# Patient Record
Sex: Male | Born: 1970 | Race: White | Hispanic: No | Marital: Single | Smoking: Former smoker
Health system: Southern US, Community
[De-identification: ages and names within clinical notes are randomized; demographics above are authoritative.]

## PROBLEM LIST (undated history)

## (undated) DIAGNOSIS — F259 Schizoaffective disorder, unspecified: Secondary | ICD-10-CM

## (undated) DIAGNOSIS — J45909 Unspecified asthma, uncomplicated: Secondary | ICD-10-CM

## (undated) DIAGNOSIS — F250 Schizoaffective disorder, bipolar type: Secondary | ICD-10-CM

## (undated) DIAGNOSIS — I10 Essential (primary) hypertension: Secondary | ICD-10-CM

## (undated) DIAGNOSIS — B1920 Unspecified viral hepatitis C without hepatic coma: Secondary | ICD-10-CM

## (undated) HISTORY — DX: Essential (primary) hypertension: I10

## (undated) HISTORY — DX: Unspecified viral hepatitis C without hepatic coma: B19.20

## (undated) HISTORY — DX: Schizoaffective disorder, unspecified: F25.9

## (undated) HISTORY — PX: NO PAST SURGERIES: SHX2092

## (undated) HISTORY — DX: Schizoaffective disorder, bipolar type: F25.0

---

## 2014-04-13 ENCOUNTER — Emergency Department (HOSPITAL_COMMUNITY)
Admission: EM | Admit: 2014-04-13 | Discharge: 2014-04-13 | Disposition: A | Discharge status: Home / Self Care | Payer: Medicaid Other | Attending: Emergency Medicine | Admitting: Emergency Medicine

## 2014-04-13 ENCOUNTER — Encounter (HOSPITAL_COMMUNITY): Payer: Self-pay | Admitting: Emergency Medicine

## 2014-04-13 DIAGNOSIS — K029 Dental caries, unspecified: Secondary | ICD-10-CM | POA: Insufficient documentation

## 2014-04-13 DIAGNOSIS — K089 Disorder of teeth and supporting structures, unspecified: Secondary | ICD-10-CM | POA: Diagnosis not present

## 2014-04-13 DIAGNOSIS — L255 Unspecified contact dermatitis due to plants, except food: Secondary | ICD-10-CM | POA: Insufficient documentation

## 2014-04-13 DIAGNOSIS — R21 Rash and other nonspecific skin eruption: Secondary | ICD-10-CM | POA: Insufficient documentation

## 2014-04-13 DIAGNOSIS — K0889 Other specified disorders of teeth and supporting structures: Secondary | ICD-10-CM

## 2014-04-13 MED ORDER — TRAMADOL HCL 50 MG PO TABS: 50.0000 mg | ORAL_TABLET | Freq: Four times a day (QID) | ORAL | Status: DC | PRN

## 2014-04-13 MED ORDER — PREDNISONE 10 MG PO TABS: ORAL_TABLET | ORAL | Status: DC

## 2014-04-13 NOTE — ED Notes (Signed)
Pt states he got into some Poision IVY about three weeks ago. Has tried "everything" to get rid of it. Has it between fingers on both hands, up lower arms, and on both feet and lower legs.

## 2014-04-13 NOTE — ED Provider Notes (Signed)
CSN: [id number]     Arrival date & time 04/13/14  1100 History   First MD Initiated Contact with Patient 04/13/14 1136    This chart was scribed for non-physician practitioner Pauline Ausammy Bralynn Velador, PA-C working with Rolland PorterMark James, MD, by Andrew Auaven Small, ED Scribe. This patient was seen in room APFT20/APFT20 and the patient's care was started at 11:38 AM. Chief Complaint  Patient presents with  . Rash   Patient is a 43 y.o. male presenting with rash. The history is provided by the patient. No language interpreter was used.  Rash Associated symptoms: no fever, no headaches, no shortness of breath, no sore throat and not wheezing     Dustin CokeMichael Small is a 43 y.o. male who presents to the Emergency Department complaining of worsening rash that began 3 weeks ago. Pt states he was working in poison ivy/oak a couple weeks ago. He reports rash has been spreading to bilateral leg, hands, feet, wrist and arms. States he has tried calamine treatments without relief to rash. He denies taking benadryl. He denies SOB, and trouble swallowing.  Pt also c/o lower dental pain. He reports an upcoming appointment with dentist. Pt is requesting pain medication until appointment    History reviewed. No pertinent past medical history. History reviewed. No pertinent past surgical history. History reviewed. No pertinent family history. History  Substance Use Topics  . Smoking status: Never Smoker   . Smokeless tobacco: Not on file  . Alcohol Use: Not on file    Review of Systems  Constitutional: Negative for fever, chills, activity change and appetite change.  HENT: Positive for dental problem. Negative for facial swelling, sore throat and trouble swallowing.   Respiratory: Negative for apnea, chest tightness, shortness of breath and wheezing.   Musculoskeletal: Negative for neck pain and neck stiffness.  Skin: Positive for rash. Negative for wound.  Neurological: Negative for dizziness, weakness, numbness and headaches.    All other systems reviewed and are negative.  Allergies  Review of patient's allergies indicates not on file.  Home Medications   Prior to Admission medications   Not on File   Triage Vitals- BP 125/69  Pulse 73  Temp(Src) 97.4 F (36.3 C) (Oral)  Resp 18  Ht 5\' 8"  (1.727 m)  Wt 209 lb (94.802 kg)  BMI 31.79 kg/m2  SpO2 100% Physical Exam  Nursing note and vitals reviewed. Constitutional: He is oriented to person, place, and time. He appears well-developed and well-nourished. No distress.  HENT:  Head: Normocephalic and atraumatic.  Mouth/Throat: Oropharynx is clear and moist and mucous membranes are normal.    Multiple dental caries no obvious dental abscess and no facial edema.   Eyes: Conjunctivae and EOM are normal.  Neck: Normal range of motion. Neck supple.  Cardiovascular: Normal rate, regular rhythm and normal heart sounds.  Exam reveals no gallop and no friction rub.   No murmur heard. Pulmonary/Chest: Effort normal and breath sounds normal. No respiratory distress. He has no wheezes. He has no rales. He exhibits no tenderness.  Musculoskeletal: Normal range of motion.  Lymphadenopathy:    He has no cervical adenopathy.  Neurological: He is alert and oriented to person, place, and time.  Skin: Skin is warm and dry.  Scattered erythematous papule to bilateral wrist dorsal hand and ankles. No vesicles or pustules. No edema.   Psychiatric: He has a normal mood and affect. His behavior is normal.    ED Course  Procedures  DIAGNOSTIC STUDIES: Oxygen Saturation is 100% on  RA, normal by my interpretation.    COORDINATION OF CARE: 11:44 AM- Pt advised of plan for treatment including benadryl and ultram and pt agrees.  Labs Review Labs Reviewed - No data to display  Imaging Review No results found.   EKG Interpretation None      MDM   Final diagnoses:  Plant dermatitis  Pain, dental   Pt is well appearing, no concerning sx's for infection to the  floor of the mouth or deep structures of the neck.  Rash appears c/w plant dermatitis, pt agrees to ultram for dental pain and prednisone taper.  VSS.  He appears stable for d/c  I personally performed the services described in this documentation, which was scribed in my presence. The recorded information has been reviewed and is accurate.      Jacorie Ernsberger L. Moya Duan, PA-C 04/16/14 1235

## 2014-04-24 NOTE — ED Provider Notes (Signed)
Medical screening examination/treatment/procedure(s) were performed by non-physician practitioner and as supervising physician I was immediately available for consultation/collaboration.   EKG Interpretation None        Rolland PorterMark Luceil Herrin, MD 04/24/14 [phone]

## 2014-04-27 ENCOUNTER — Emergency Department (HOSPITAL_COMMUNITY)
Admission: EM | Admit: 2014-04-27 | Discharge: 2014-04-27 | Disposition: A | Discharge status: Home / Self Care | Payer: Medicaid Other | Attending: Emergency Medicine | Admitting: Emergency Medicine

## 2014-04-27 ENCOUNTER — Encounter (HOSPITAL_COMMUNITY): Payer: Self-pay | Admitting: Emergency Medicine

## 2014-04-27 DIAGNOSIS — L03039 Cellulitis of unspecified toe: Principal | ICD-10-CM | POA: Insufficient documentation

## 2014-04-27 DIAGNOSIS — Z79899 Other long term (current) drug therapy: Secondary | ICD-10-CM | POA: Diagnosis not present

## 2014-04-27 DIAGNOSIS — Z4800 Encounter for change or removal of nonsurgical wound dressing: Secondary | ICD-10-CM | POA: Diagnosis present

## 2014-04-27 DIAGNOSIS — L03032 Cellulitis of left toe: Secondary | ICD-10-CM

## 2014-04-27 DIAGNOSIS — T63004D Toxic effect of unspecified snake venom, undetermined, subsequent encounter: Secondary | ICD-10-CM

## 2014-04-27 DIAGNOSIS — R509 Fever, unspecified: Secondary | ICD-10-CM | POA: Diagnosis not present

## 2014-04-27 DIAGNOSIS — L02619 Cutaneous abscess of unspecified foot: Secondary | ICD-10-CM | POA: Insufficient documentation

## 2014-04-27 LAB — CBC WITH DIFFERENTIAL/PLATELET
Basophils Absolute: 0.1 10*3/uL (ref 0.0–0.1)
Basophils Relative: 1 % (ref 0–1)
EOS PCT: 2 % (ref 0–5)
Eosinophils Absolute: 0.2 10*3/uL (ref 0.0–0.7)
HEMATOCRIT: 40.5 % (ref 39.0–52.0)
HEMOGLOBIN: 14.1 g/dL (ref 13.0–17.0)
LYMPHS ABS: 1.3 10*3/uL (ref 0.7–4.0)
LYMPHS PCT: 18 % (ref 12–46)
MCH: 31.7 pg (ref 26.0–34.0)
MCHC: 34.8 g/dL (ref 30.0–36.0)
MCV: 91 fL (ref 78.0–100.0)
Monocytes Absolute: 0.3 10*3/uL (ref 0.1–1.0)
Monocytes Relative: 4 % (ref 3–12)
NEUTROS PCT: 75 % (ref 43–77)
Neutro Abs: 5.7 10*3/uL (ref 1.7–7.7)
Platelets: 212 10*3/uL (ref 150–400)
RBC: 4.45 MIL/uL (ref 4.22–5.81)
RDW: 12.6 % (ref 11.5–15.5)
WBC: 7.6 10*3/uL (ref 4.0–10.5)

## 2014-04-27 MED ORDER — OXYCODONE-ACETAMINOPHEN 5-325 MG PO TABS: 1.0000 | ORAL_TABLET | Freq: Four times a day (QID) | ORAL | Status: DC | PRN

## 2014-04-27 MED ORDER — HYDROCORTISONE 1 % EX CREA
TOPICAL_CREAM | CUTANEOUS | Status: DC
Start: 2014-04-27 — End: 2014-06-17

## 2014-04-27 MED ORDER — OXYCODONE-ACETAMINOPHEN 5-325 MG PO TABS
2.0000 | ORAL_TABLET | Freq: Once | ORAL | Status: AC
  Administered 2014-04-27: 2 via ORAL
  Filled 2014-04-27: qty 2

## 2014-04-27 MED ORDER — CEPHALEXIN 500 MG PO CAPS
500.0000 mg | ORAL_CAPSULE | Freq: Once | ORAL | Status: AC
  Administered 2014-04-27: 500 mg via ORAL
  Filled 2014-04-27: qty 1

## 2014-04-27 MED ORDER — CEPHALEXIN 500 MG PO CAPS: 500.0000 mg | ORAL_CAPSULE | Freq: Four times a day (QID) | ORAL | Status: DC

## 2014-04-27 NOTE — Discharge Instructions (Signed)
Snake Bite Snakes may be either venomous (containing poison) or nonvenomous (nonpoisonous). A nonvenomous snake bite will cause trauma or a wound to the skin and possibly the deeper tissues. A venomous snake will also cause a traumatic wound, but more importantly, it may have injected venom into the wound. Snake bite venom can be extremely serious and even deadly. One type of venom may cause major skin, tissue and muscle damage, and failure of normal blood clotting. This may cause extreme swelling and pain of the affected area. Another type of venom can affect the brain and nervous system and may cause death. The treatment for venomous snake bite may require the use of antivenom medicine. If you are unsure if your bite is from a venomous snake, you MUST seek immediate medical attention. YOU MIGHT NEED A TETANUS SHOT NOW IF:  You have no idea when you had the last one.  You have never had a tetanus shot before.  The bite broke your skin. If you need a tetanus shot, and you decide not to get one, there is a rare chance of getting tetanus. Sickness from tetanus can be serious. HOME CARE INSTRUCTIONS  A snake bit you and caused a skin wound. It may or may not have been venomous. If the snake was venomous, a small amount of venom may have been injected into your skin.  Keep the bite area clean and dry.  Keep the extremity elevated above the level of the heart for the next 48 hours.  Wash the bite area 3 times daily with soap and water or an antiseptic. Apply an adhesive or gauze bandage to the bite area.  If you develop blistering of any type at the site of the bite, protect the blisters from breaking. Do not attempt to open it.  If you were given a tetanus shot, your arm may get swollen, red and warm at the shot site. This is a common response to the injection. SEEK IMMEDIATE MEDICAL CARE IF:   You develop symptoms of poisoning including increased pain, redness, swelling, blood blisters or purple  spots in the bite area, nausea, vomiting, numbness, tingling, excessive sweating, breathing difficulty, blurred vision, feelings of lightheadedness, or feeling faint. If you develop symptoms of poisoning, you MUST seek immediate medical attention.  The bite becomes infected. Symptoms may include redness, swelling, pain, tenderness, pus, red streaks running from the wound, or an oral temperature above 102 F (38.9 C), not controlled by medicine.  Your condition or wound becomes worse. MAKE SURE YOU:   Understand these instructions.  Will watch your condition.  Will get help right away if you are not doing well or get worse. Document Released: 09/08/2000 Document Revised: 12/04/2011 Document Reviewed: 02/02/2010 [facility] Patient Information 2015 [place] [place]. This information is not intended to replace advice given to you by your health care provider. Make sure you discuss any questions you have with your health care provider. Cellulitis Cellulitis is an infection of the skin and the tissue beneath it. The infected area is usually red and tender. Cellulitis occurs most often in the arms and lower legs.  CAUSES  Cellulitis is caused by bacteria that enter the skin through cracks or cuts in the skin. The most common types of bacteria that cause cellulitis are staphylococci and streptococci. SIGNS AND SYMPTOMS   Redness and warmth.  Swelling.  Tenderness or pain.  Fever. DIAGNOSIS  Your health care provider can usually determine what is wrong based on a physical exam. Blood tests  may also be done. TREATMENT  Treatment usually involves taking an antibiotic medicine. HOME CARE INSTRUCTIONS   Take your antibiotic medicine as directed by your health care provider. Finish the antibiotic even if you start to feel better.  Keep the infected arm or leg elevated to reduce swelling.  Apply a warm cloth to the affected area up to 4 times per day to relieve pain.  Take medicines only as  directed by your health care provider.  Keep all follow-up visits as directed by your health care provider. SEEK MEDICAL CARE IF:   You notice red streaks coming from the infected area.  Your red area gets larger or turns dark in color.  Your bone or joint underneath the infected area becomes painful after the skin has healed.  Your infection returns in the same area or another area.  You notice a swollen bump in the infected area.  You develop new symptoms.  You have a fever. SEEK IMMEDIATE MEDICAL CARE IF:   You feel very sleepy.  You develop vomiting or diarrhea.  You have a general ill feeling (malaise) with muscle aches and pains. MAKE SURE YOU:   Understand these instructions.  Will watch your condition.  Will get help right away if you are not doing well or get worse. Document Released: 06/21/2005 Document Revised: 01/26/2014 Document Reviewed: 11/27/2011 [facility] Patient Information 2015 [place], [place]. This information is not intended to replace advice given to you by your health care provider. Make sure you discuss any questions you have with your health care provider.

## 2014-04-27 NOTE — ED Notes (Signed)
Follow up snake bite on Friday to left foot.  C/o pain rating pain 10.

## 2014-04-27 NOTE — ED Provider Notes (Signed)
CSN: [id number]     Arrival date & time 04/27/14  [id number] History   First MD Initiated Contact with Patient 04/27/14 0944    This chart was scribed for Shon Batonourtney F Horton, MD by Marica OtterNusrat Rahman, ED Scribe. This patient was seen in room APA01/APA01 and the patient's care was started at 9:49 AM.  Chief Complaint  Patient presents with  . Snake Bite  PCP: No PCP Per Patient  The history is provided by the patient. No language interpreter was used.   HPI Comments: Dustin Small is a 43 y.o. male who presents to the Emergency Department for a follow-up to a snake bite to his left foot sustained on 03/18/14.  Pt [facility] and was admitted to the hospital. Pt was discharged from the hospital on 03/19/14. Pt states that he was provided Rx for pain meds at time of discharge which pt ran out of today. Rates pain at 10/10.  Worse with ambulation.  Pt also complains of associated intermittent redness to his right foot and swelling ((though pt note his swelling has subsided considerably). Pt also complains of associated intermittent fever, stating that he had a temperature of 101 degrees yesterday.   History reviewed. No pertinent past medical history. History reviewed. No pertinent past surgical history. History reviewed. No pertinent family history. History  Substance Use Topics  . Smoking status: Never Smoker   . Smokeless tobacco: Not on file  . Alcohol Use: Not on file    Review of Systems  Constitutional: Positive for fever.  Musculoskeletal:       Left foot pain  Skin: Positive for color change and wound.  All other systems reviewed and are negative.     Allergies  Review of patient's allergies indicates no known allergies.  Home Medications   Prior to Admission medications   Medication Sig Start Date End Date Taking? Authorizing Provider  albuterol (PROVENTIL HFA;VENTOLIN HFA) 108 (90 BASE) MCG/ACT inhaler Inhale 1 puff into the  lungs every 6 (six) hours as needed for wheezing or shortness of breath.   Yes Historical Provider, MD  haloperidol (HALDOL) 5 MG tablet Take 5 mg by mouth at bedtime.   Yes Historical Provider, MD  lisinopril-hydrochlorothiazide (PRINZIDE,ZESTORETIC) 10-12.5 MG per tablet Take 1 tablet by mouth daily.   Yes Historical Provider, MD  oxyCODONE-acetaminophen (PERCOCET/ROXICET) 5-325 MG per tablet Take 1 tablet by mouth every 4 (four) hours as needed for moderate pain.  04/18/14  Yes Historical Provider, MD  trazodone (DESYREL) 300 MG tablet Take 300 mg by mouth at bedtime.   Yes Historical Provider, MD  cephALEXin (KEFLEX) 500 MG capsule Take 1 capsule (500 mg total) by mouth 4 (four) times daily. 04/27/14   Shon Batonourtney F Horton, MD  oxyCODONE-acetaminophen (PERCOCET/ROXICET) 5-325 MG per tablet Take 1-2 tablets by mouth every 6 (six) hours as needed for severe pain. 04/27/14   Shon Batonourtney F Horton, MD   Triage Vitals: BP 119/69  Pulse 88  Temp(Src) 97.7 F (36.5 C) (Oral)  Ht 5\' 7"  (1.702 m)  Wt 209 lb (94.802 kg)  BMI 32.73 kg/m2  SpO2 100% Physical Exam  Nursing note and vitals reviewed. Constitutional: He is oriented to person, place, and time. He appears well-developed and well-nourished. No distress.  HENT:  Head: Normocephalic and atraumatic.  Cardiovascular: Normal rate, regular rhythm and normal heart sounds.   No murmur heard. Pulmonary/Chest: Effort normal and breath sounds normal. No respiratory distress. He has no  wheezes.  Musculoskeletal:  Focused examination of the left lower extremity reveals swelling mostly involving the left foot, there is mild erythema over the lateral aspect of the dorsum of the left foot proximal to the bite site, 2 punctate wounds noted over the left fourth digit and left second digit  Neurological: He is alert and oriented to person, place, and time.  Skin: Skin is warm and dry.  Blanching erythema of the dorsum of left foot as described above  Psychiatric: He  has a normal mood and affect.    ED Course  Procedures (including critical care time) DIAGNOSTIC STUDIES: Oxygen Saturation is 100% on RA, nl by my interpretation.    COORDINATION OF CARE: 9:54 AM-Discussed treatment plan which includes  with pt at bedside and pt agreed to plan.   Labs Review Labs Reviewed  CBC WITH DIFFERENTIAL    Imaging Review No results found.   EKG Interpretation None      MDM   Final diagnoses:  Snake bite, undetermined intent, subsequent encounter  Cellulitis of toe of left foot   Patient presents for followup following a snake bite. He was bitten approximately 10 days ago. I reviewed the patient's records. He was admitted to [facility] and was given CroFab. He was discharged on July 25. Patient reports worsening pain after running out of his pain medication yesterday. He also has residual redness and swelling although he endorses this is better. CBC obtained to evaluate for leukocytosis given fever and continued redness. CBC reassuring including white count and platelets. Patient was given pain medications. Would expect at this point for redness be partially resolved. Given puncture wound and risk  for overlying cellulitis, we'll treat with Keflex for 7 days.  Patient also be given a prescription for pain medications.  After history, exam, and medical workup I feel the patient has been appropriately medically screened and is safe for discharge home. Pertinent diagnoses were discussed with the patient. Patient was given return precautions.   I personally performed the services described in this documentation, which was scribed in my presence. The recorded information has been reviewed and is accurate.    Shon Baton, MD 04/27/14 1056

## 2014-06-17 ENCOUNTER — Encounter (HOSPITAL_COMMUNITY): Payer: Self-pay | Admitting: Emergency Medicine

## 2014-06-17 ENCOUNTER — Emergency Department (HOSPITAL_COMMUNITY)
Admission: EM | Admit: 2014-06-17 | Discharge: 2014-06-17 | Disposition: A | Discharge status: Home / Self Care | Payer: Medicaid Other | Attending: Emergency Medicine | Admitting: Emergency Medicine

## 2014-06-17 DIAGNOSIS — R21 Rash and other nonspecific skin eruption: Secondary | ICD-10-CM | POA: Diagnosis present

## 2014-06-17 DIAGNOSIS — B86 Scabies: Secondary | ICD-10-CM | POA: Diagnosis not present

## 2014-06-17 DIAGNOSIS — Z87891 Personal history of nicotine dependence: Secondary | ICD-10-CM | POA: Insufficient documentation

## 2014-06-17 DIAGNOSIS — Z79899 Other long term (current) drug therapy: Secondary | ICD-10-CM | POA: Insufficient documentation

## 2014-06-17 MED ORDER — PERMETHRIN 5 % EX CREA
TOPICAL_CREAM | CUTANEOUS | Status: DC
Start: 2014-06-17 — End: 2018-02-13

## 2014-06-17 NOTE — Discharge Instructions (Signed)

## 2014-06-17 NOTE — ED Provider Notes (Signed)
Medical screening examination/treatment/procedure(s) were performed by non-physician practitioner and as supervising physician I was immediately available for consultation/collaboration.   EKG Interpretation None        Benny Lennert, MD 06/17/14 [phone]

## 2014-06-17 NOTE — ED Notes (Signed)
Red pin point raised rash to bilateral feet and bilateral hands/arms. Burns when in sunlight. Has been seen and given ointment for the rash without any relief.

## 2014-06-17 NOTE — ED Provider Notes (Signed)
CSN: [id number]     Arrival date & time 06/17/14  1407 History   First MD Initiated Contact with Patient 06/17/14 1536     Chief Complaint  Patient presents with  . Rash     (Consider location/radiation/quality/duration/timing/severity/associated sxs/prior Treatment) Patient is a 43 y.o. male presenting with rash. The history is provided by the patient. No language interpreter was used.  Rash Location:  Shoulder/arm and foot Shoulder/arm rash location:  L wrist, R wrist, L hand and R hand Foot rash location:  L ankle, R ankle, L foot and R foot Quality: itchiness   Duration:  3 months Progression:  Worsening Associated symptoms: no fever and no nausea   Associated symptoms comment:  Rash that started on feet three months earlier, now on lower extremities, hands and wrists.    History reviewed. No pertinent past medical history. History reviewed. No pertinent past surgical history. History reviewed. No pertinent family history. History  Substance Use Topics  . Smoking status: Former Games developer  . Smokeless tobacco: Not on file  . Alcohol Use: No    Review of Systems  Constitutional: Negative for fever.  HENT: Negative.   Respiratory: Negative.   Cardiovascular: Negative.   Gastrointestinal: Negative.  Negative for nausea.  Musculoskeletal: Negative.   Skin: Positive for rash.      Allergies  Review of patient's allergies indicates no known allergies.  Home Medications   Prior to Admission medications   Medication Sig Start Date End Date Taking? Authorizing Provider  albuterol (PROVENTIL HFA;VENTOLIN HFA) 108 (90 BASE) MCG/ACT inhaler Inhale 1 puff into the lungs every 6 (six) hours as needed for wheezing or shortness of breath.   Yes Historical Provider, MD  haloperidol (HALDOL) 5 MG tablet Take 5 mg by mouth at bedtime.   Yes Historical Provider, MD  lisinopril-hydrochlorothiazide (PRINZIDE,ZESTORETIC) 10-12.5 MG per tablet Take 1 tablet by mouth daily.   Yes  Historical Provider, MD  trazodone (DESYREL) 300 MG tablet Take 300 mg by mouth at bedtime.   Yes Historical Provider, MD   BP 133/83  Pulse 66  Temp(Src) 98 F (36.7 C) (Oral)  Resp 14  Ht  (1.702 m)  Wt 200 lb (90.719 kg)  BMI 31.32 kg/m2  SpO2 99% Physical Exam  Constitutional: He is oriented to person, place, and time. He appears well-developed and well-nourished.  Neck: Normal range of motion.  Pulmonary/Chest: Effort normal.  Musculoskeletal: Normal range of motion.  Neurological: He is alert and oriented to person, place, and time.  Skin: Skin is warm and dry.  Raised rash to dorsum hands, including web spaces, extending along volar aspect forearms. Similar rash to dorsal feet to ankles and lower legs. Various stages of scabbing and new eruption.  Psychiatric: He has a normal mood and affect.    ED Course  Procedures (including critical care time) Labs Review Labs Reviewed - No data to display  Imaging Review No results found.   EKG Interpretation None      MDM   Final diagnoses:  None    1. Scabies  Discussed care of infection, including household cleaning.     Arnoldo Hooker, PA-C 06/17/14 1601

## 2016-06-09 ENCOUNTER — Other Ambulatory Visit (HOSPITAL_COMMUNITY)
Admission: RE | Admit: 2016-06-09 | Discharge: 2016-06-09 | Disposition: A | Discharge status: Home / Self Care | Payer: Medicaid Other | Source: Ambulatory Visit | Attending: Internal Medicine | Admitting: Internal Medicine

## 2016-06-09 DIAGNOSIS — B1920 Unspecified viral hepatitis C without hepatic coma: Secondary | ICD-10-CM | POA: Insufficient documentation

## 2016-06-09 LAB — FERRITIN: FERRITIN: 116 ng/mL (ref 24–336)

## 2016-06-09 LAB — IRON AND TIBC
IRON: 89 ug/dL (ref 45–182)
Saturation Ratios: 19 % (ref 17.9–39.5)
TIBC: 461 ug/dL — ABNORMAL HIGH (ref 250–450)
UIBC: 372 ug/dL

## 2016-06-10 LAB — HEPATITIS A ANTIBODY, TOTAL: Hep A Total Ab: NEGATIVE

## 2016-06-10 LAB — HEPATITIS B SURFACE ANTIBODY, QUANTITATIVE: Hepatitis B-Post: <3.1 m[IU]/mL — ABNORMAL LOW (ref >9.9)

## 2016-06-10 LAB — HCV RNA (INTERNATIONAL UNITS)
HCV LOG10: 7.427 {Log_IU}/mL
HCV RNA (International Units): 26700000 IU/mL

## 2016-06-10 LAB — HCV RNA QUANT

## 2016-06-10 LAB — HEPATITIS B CORE ANTIBODY, TOTAL: HEP B C TOTAL AB: NEGATIVE

## 2016-06-10 LAB — HEPATITIS B SURFACE ANTIGEN: Hepatitis B Surface Ag: NEGATIVE

## 2016-06-10 LAB — ALPHA-1-ANTITRYPSIN: A-1 Antitrypsin, Ser: 134 mg/dL (ref 90–200)

## 2016-06-12 LAB — ANTINUCLEAR ANTIBODIES, IFA: ANA Ab, IFA: NEGATIVE

## 2016-06-13 ENCOUNTER — Other Ambulatory Visit (HOSPITAL_COMMUNITY): Payer: Self-pay | Admitting: Internal Medicine

## 2016-06-13 DIAGNOSIS — B182 Chronic viral hepatitis C: Secondary | ICD-10-CM

## 2016-06-20 ENCOUNTER — Ambulatory Visit (HOSPITAL_COMMUNITY)
Admission: RE | Admit: 2016-06-20 | Discharge: 2016-06-20 | Disposition: A | Discharge status: Home / Self Care | Payer: Medicaid Other | Source: Ambulatory Visit | Attending: Internal Medicine | Admitting: Internal Medicine

## 2016-06-20 DIAGNOSIS — Z228 Carrier of other infectious diseases: Secondary | ICD-10-CM | POA: Insufficient documentation

## 2016-06-20 DIAGNOSIS — B182 Chronic viral hepatitis C: Secondary | ICD-10-CM | POA: Insufficient documentation

## 2016-06-20 DIAGNOSIS — N200 Calculus of kidney: Secondary | ICD-10-CM | POA: Diagnosis not present

## 2016-06-20 DIAGNOSIS — K760 Fatty (change of) liver, not elsewhere classified: Secondary | ICD-10-CM | POA: Insufficient documentation

## 2016-06-20 DIAGNOSIS — N2889 Other specified disorders of kidney and ureter: Secondary | ICD-10-CM | POA: Insufficient documentation

## 2018-02-13 ENCOUNTER — Ambulatory Visit (INDEPENDENT_AMBULATORY_CARE_PROVIDER_SITE_OTHER): Payer: Medicaid Other | Admitting: Internal Medicine

## 2018-02-13 ENCOUNTER — Encounter (INDEPENDENT_AMBULATORY_CARE_PROVIDER_SITE_OTHER): Payer: Self-pay | Admitting: Internal Medicine

## 2018-02-13 VITALS — BP 140/66 | HR 68 | Temp 97.7°F | Ht 68.0 in | Wt 198.3 lb

## 2018-02-13 DIAGNOSIS — B182 Chronic viral hepatitis C: Secondary | ICD-10-CM | POA: Diagnosis not present

## 2018-02-13 NOTE — Progress Notes (Addendum)
   Subjective:    Patient ID: Dustin Small, male    DOB: 1971/06/26, 46 y.o.   MRN: [id number]  HPI Referred by Dr. Neita Carp for Hep. C. Was evaluated by Dr. Teena Dunk back in October of 2017. He was treated with Mavyret x 12 weeks. Genotype 1A Hx of cocaine abuse years. No Hx of IV drug abuse.  Has received Hep A and B per records; 04/28/2015 Hep C antibody positive Hep C quaint negative. He tell me he is dong . Sometime tightness rt flank at time. Appetite is good; No weight loss.  BMs x every other day. No melena or BRRB.   Review of Systems Past Medical History:  Diagnosis Date  . Hepatitis C   . High blood pressure   . Schizo affective schizophrenia (HCC)     History reviewed. No pertinent surgical history.  No Known Allergies  Current Outpatient Medications on File Prior to Visit  Medication Sig Dispense Refill  . albuterol (PROVENTIL HFA;VENTOLIN HFA) 108 (90 BASE) MCG/ACT inhaler Inhale 1 puff into the lungs every 6 (six) hours as needed for wheezing or shortness of breath.    . lisinopril-hydrochlorothiazide (PRINZIDE,ZESTORETIC) 10-12.5 MG per tablet Take 1 tablet by mouth daily.    . trazodone (DESYREL) 300 MG tablet Take 300 mg by mouth at bedtime.     No current facility-administered medications on file prior to visit.         Objective:   Physical Exam Blood pressure 140/66, pulse 68, temperature 97.7 F (36.5 C), height  (1.727 m), weight 198 lb 4.8 oz (89.9 kg). Alert and oriented. Skin warm and dry. Oral mucosa is moist.   . Sclera anicteric, conjunctivae is pink. Thyroid not enlarged. No cervical lymphadenopathy. Lungs clear. Heart regular rate and rhythm.  Abdomen is soft. Bowel sounds are positive. No hepatomegaly. No abdominal masses felt. No tenderness.  No edema to lower extremities.           Assessment & Plan:  Hepatitis C. Hep C quaint, Hepatic function, [place] RUQ. Will get labs from [facility]. OV in 1 year.

## 2018-02-13 NOTE — Patient Instructions (Signed)
OV in 1 year.  

## 2018-02-18 LAB — HEPATITIS PANEL, ACUTE
HEP B C IGM: NONREACTIVE
HEP C AB: REACTIVE — AB
Hep A IgM: NONREACTIVE
Hepatitis B Surface Ag: NONREACTIVE
SIGNAL TO CUT-OFF: 31.5 — ABNORMAL HIGH (ref <1.00)

## 2018-02-18 LAB — HEPATITIS B DNA, ULTRAQUANTITATIVE, PCR
Hepatitis B DNA (Calc): <1 Log IU/mL
Hepatitis B DNA: <10 IU/mL

## 2018-02-18 LAB — HCV RNA,QUANTITATIVE REAL TIME PCR
HCV Quantitative Log: <1.18 Log IU/mL
HCV RNA, PCR, QN: NOT DETECTED [IU]/mL

## 2018-02-18 LAB — HEPATIC FUNCTION PANEL
AG Ratio: 1.7 (calc) (ref 1.0–2.5)
ALT: 19 U/L (ref 9–46)
AST: 25 U/L (ref 10–40)
Albumin: 5 g/dL (ref 3.6–5.1)
Alkaline phosphatase (APISO): 33 U/L — ABNORMAL LOW (ref 40–115)
BILIRUBIN INDIRECT: 0.4 mg/dL (ref 0.2–1.2)
Bilirubin, Direct: 0.1 mg/dL (ref 0.0–0.2)
Globulin: 3 g/dL (calc) (ref 1.9–3.7)
Total Bilirubin: 0.5 mg/dL (ref 0.2–1.2)
Total Protein: 8 g/dL (ref 6.1–8.1)

## 2018-02-22 ENCOUNTER — Ambulatory Visit (HOSPITAL_COMMUNITY)
Admission: RE | Admit: 2018-02-22 | Discharge: 2018-02-22 | Disposition: A | Discharge status: Home / Self Care | Payer: Medicaid Other | Source: Ambulatory Visit | Attending: Internal Medicine | Admitting: Internal Medicine

## 2018-02-22 DIAGNOSIS — B182 Chronic viral hepatitis C: Secondary | ICD-10-CM

## 2019-02-18 ENCOUNTER — Ambulatory Visit (INDEPENDENT_AMBULATORY_CARE_PROVIDER_SITE_OTHER): Payer: Medicaid Other | Admitting: Internal Medicine

## 2019-02-18 ENCOUNTER — Other Ambulatory Visit: Payer: Self-pay

## 2019-02-18 ENCOUNTER — Encounter (INDEPENDENT_AMBULATORY_CARE_PROVIDER_SITE_OTHER): Payer: Self-pay | Admitting: Internal Medicine

## 2019-02-18 VITALS — BP 144/84 | HR 65 | Temp 98.1°F | Ht 68.0 in | Wt 202.0 lb

## 2019-02-18 DIAGNOSIS — B182 Chronic viral hepatitis C: Secondary | ICD-10-CM | POA: Diagnosis not present

## 2019-02-18 LAB — COMPREHENSIVE METABOLIC PANEL
AG Ratio: 1.7 (calc) (ref 1.0–2.5)
ALT: 32 U/L (ref 9–46)
AST: 27 U/L (ref 10–35)
Albumin: 4.9 g/dL (ref 3.6–5.1)
Alkaline phosphatase (APISO): 31 U/L — ABNORMAL LOW (ref 35–144)
BUN: 17 mg/dL (ref 7–25)
CO2: 27 mmol/L (ref 20–32)
Calcium: 9.9 mg/dL (ref 8.6–10.3)
Chloride: 104 mmol/L (ref 98–110)
Creat: 1.2 mg/dL (ref 0.70–1.33)
Globulin: 2.9 g/dL (calc) (ref 1.9–3.7)
Glucose, Bld: 98 mg/dL (ref 65–139)
Potassium: 5 mmol/L (ref 3.5–5.3)
Sodium: 138 mmol/L (ref 135–146)
Total Bilirubin: 0.3 mg/dL (ref 0.2–1.2)
Total Protein: 7.8 g/dL (ref 6.1–8.1)

## 2019-02-18 LAB — CBC WITH DIFFERENTIAL/PLATELET
Absolute Monocytes: 425 cells/uL (ref 200–950)
Basophils Absolute: 50 cells/uL (ref 0–200)
Basophils Relative: 0.7 %
Eosinophils Absolute: 101 cells/uL (ref 15–500)
Eosinophils Relative: 1.4 %
HCT: 39.9 % (ref 38.5–50.0)
Hemoglobin: 13.5 g/dL (ref 13.2–17.1)
Lymphs Abs: 1562 cells/uL (ref 850–3900)
MCH: 31.3 pg (ref 27.0–33.0)
MCHC: 33.8 g/dL (ref 32.0–36.0)
MCV: 92.6 fL (ref 80.0–100.0)
MPV: 10.2 fL (ref 7.5–12.5)
Monocytes Relative: 5.9 %
Neutro Abs: 5062 cells/uL (ref 1500–7800)
Neutrophils Relative %: 70.3 %
Platelets: 217 10*3/uL (ref 140–400)
RBC: 4.31 10*6/uL (ref 4.20–5.80)
RDW: 12.2 % (ref 11.0–15.0)
Total Lymphocyte: 21.7 %
WBC: 7.2 10*3/uL (ref 3.8–10.8)

## 2019-02-18 NOTE — Progress Notes (Signed)
   Subjective:    Patient ID: Dustin Small, male    DOB: 15-Sep-1971, 48 y.o.   MRN: [id number]  HPI Here for one year follow up. Last seen in May of 2019. Hx of Hepatitis C. Treated with Mavyret x 12 weeks by Dr. Teena Dunk back in 2017.  Genotype 1 A.  Hx of cocaine and IV drug use. Has received Hep A and B vaccine.  He tells me he is doing. No GI complaints. C/o rt flank pain. He was advised to follow up with [facility]. His appetite is good. He has gained 4 pounds. BMs are normal . He is not doing drugs.   02/13/2018 Hep C quaint undetected. Hepatic Function Latest Ref Rng & Units 02/13/2018  Total Protein 6.1 - 8.1 g/dL 8.0  AST 10 - 40 U/L 25  ALT 9 - 46 U/L 19  Total Bilirubin 0.2 - 1.2 mg/dL 0.5  Bilirubin, Direct 0.0 - 0.2 mg/dL 0.1    0/17/4944 [place] RUQ:  IMPRESSION: 1. Mild increased hepatic echogenicity is again noted consistent with fatty infiltration and/or hepatocellular disease. No focal hepatic abnormality.  2. Exam otherwise unremarkable. No gallstones or biliary distention.   Review of Systems Past Medical History:  Diagnosis Date  . Hepatitis C   . High blood pressure   . Schizo affective schizophrenia (HCC)     No past surgical history on file.  No Known Allergies  Current Outpatient Medications on File Prior to Visit  Medication Sig Dispense Refill  . albuterol (PROVENTIL HFA;VENTOLIN HFA) 108 (90 BASE) MCG/ACT inhaler Inhale 1 puff into the lungs every 6 (six) hours as needed for wheezing or shortness of breath.    . lisinopril-hydrochlorothiazide (PRINZIDE,ZESTORETIC) 10-12.5 MG per tablet Take 1 tablet by mouth daily.    . trazodone (DESYREL) 300 MG tablet Take 300 mg by mouth at bedtime.     No current facility-administered medications on file prior to visit.         Objective:   Physical Exam Blood pressure (!) 144/84, pulse 65, temperature 98.1 F (36.7 C), height 5\' 8"  (1.727 m), weight 202 lb (91.6 kg). Alert and oriented. Skin warm and  dry. Oral mucosa is moist.   . Sclera anicteric, conjunctivae is pink. Thyroid not enlarged. No cervical lymphadenopathy. Lungs clear. Heart regular rate and rhythm.  Abdomen is soft. Bowel sounds are positive. No hepatomegaly. No abdominal masses felt. No tenderness.  No edema to lower extremities.          Assessment & Plan:  Hepatitis C. He is doing well. Will see back in 1 year. CMET and CBC, [place] RUQ OV in 1 year.

## 2019-02-21 ENCOUNTER — Other Ambulatory Visit: Payer: Self-pay

## 2019-02-21 ENCOUNTER — Ambulatory Visit (HOSPITAL_COMMUNITY)
Admission: RE | Admit: 2019-02-21 | Discharge: 2019-02-21 | Disposition: A | Discharge status: Home / Self Care | Payer: Medicaid Other | Source: Ambulatory Visit | Attending: Internal Medicine | Admitting: Internal Medicine

## 2019-02-21 DIAGNOSIS — B182 Chronic viral hepatitis C: Secondary | ICD-10-CM | POA: Insufficient documentation

## 2019-03-04 ENCOUNTER — Other Ambulatory Visit (INDEPENDENT_AMBULATORY_CARE_PROVIDER_SITE_OTHER): Payer: Self-pay | Admitting: Internal Medicine

## 2019-03-04 DIAGNOSIS — Z1211 Encounter for screening for malignant neoplasm of colon: Secondary | ICD-10-CM

## 2019-04-23 ENCOUNTER — Telehealth (INDEPENDENT_AMBULATORY_CARE_PROVIDER_SITE_OTHER): Payer: Self-pay | Admitting: *Deleted

## 2019-04-23 ENCOUNTER — Encounter (INDEPENDENT_AMBULATORY_CARE_PROVIDER_SITE_OTHER): Payer: Self-pay | Admitting: *Deleted

## 2019-04-23 MED ORDER — PEG 3350-KCL-NA BICARB-NACL 420 G PO SOLR: 4000.0000 mL | Freq: Once | ORAL | 0 refills | Status: AC

## 2019-04-23 NOTE — Telephone Encounter (Signed)
Patient needs trilyte 

## 2019-05-19 ENCOUNTER — Encounter (INDEPENDENT_AMBULATORY_CARE_PROVIDER_SITE_OTHER): Payer: Self-pay | Admitting: *Deleted

## 2019-05-26 ENCOUNTER — Other Ambulatory Visit: Payer: Self-pay

## 2019-05-26 ENCOUNTER — Other Ambulatory Visit (HOSPITAL_COMMUNITY)
Admission: RE | Admit: 2019-05-26 | Discharge: 2019-05-26 | Disposition: A | Discharge status: Home / Self Care | Payer: Medicaid Other | Source: Ambulatory Visit | Attending: Internal Medicine | Admitting: Internal Medicine

## 2019-05-27 ENCOUNTER — Encounter (INDEPENDENT_AMBULATORY_CARE_PROVIDER_SITE_OTHER): Payer: Self-pay | Admitting: *Deleted

## 2019-07-08 ENCOUNTER — Other Ambulatory Visit (HOSPITAL_COMMUNITY)
Admission: RE | Admit: 2019-07-08 | Discharge: 2019-07-08 | Disposition: A | Discharge status: Home / Self Care | Payer: Medicaid Other | Source: Ambulatory Visit | Attending: Internal Medicine | Admitting: Internal Medicine

## 2019-07-08 ENCOUNTER — Other Ambulatory Visit: Payer: Self-pay

## 2019-07-09 ENCOUNTER — Telehealth (INDEPENDENT_AMBULATORY_CARE_PROVIDER_SITE_OTHER): Payer: Self-pay | Admitting: *Deleted

## 2019-07-09 NOTE — Telephone Encounter (Signed)
patietn didn;t show for COVID test on 07/08/19, I called him today to see what was going on, he told me he has a bad UTI and just cancel and he will call me when he is feeling better

## 2019-07-10 ENCOUNTER — Ambulatory Visit (HOSPITAL_COMMUNITY): Admission: RE | Admit: 2019-07-10 | Payer: Medicaid Other | Source: Home / Self Care | Admitting: Internal Medicine

## 2019-07-10 ENCOUNTER — Encounter (HOSPITAL_COMMUNITY): Admission: RE | Payer: Self-pay | Source: Home / Self Care

## 2019-07-10 SURGERY — COLONOSCOPY
Anesthesia: Moderate Sedation

## 2019-07-14 ENCOUNTER — Encounter (HOSPITAL_COMMUNITY): Payer: Self-pay | Admitting: Emergency Medicine

## 2019-07-14 ENCOUNTER — Emergency Department (HOSPITAL_COMMUNITY): Payer: Medicaid Other

## 2019-07-14 ENCOUNTER — Other Ambulatory Visit: Payer: Self-pay

## 2019-07-14 ENCOUNTER — Emergency Department (HOSPITAL_COMMUNITY)
Admission: EM | Admit: 2019-07-14 | Discharge: 2019-07-14 | Disposition: A | Discharge status: Home / Self Care | Payer: Medicaid Other | Attending: Emergency Medicine | Admitting: Emergency Medicine

## 2019-07-14 DIAGNOSIS — R110 Nausea: Secondary | ICD-10-CM | POA: Insufficient documentation

## 2019-07-14 DIAGNOSIS — Z87891 Personal history of nicotine dependence: Secondary | ICD-10-CM | POA: Diagnosis not present

## 2019-07-14 DIAGNOSIS — I10 Essential (primary) hypertension: Secondary | ICD-10-CM | POA: Diagnosis not present

## 2019-07-14 DIAGNOSIS — Z79899 Other long term (current) drug therapy: Secondary | ICD-10-CM | POA: Insufficient documentation

## 2019-07-14 DIAGNOSIS — R1013 Epigastric pain: Secondary | ICD-10-CM

## 2019-07-14 DIAGNOSIS — R109 Unspecified abdominal pain: Secondary | ICD-10-CM | POA: Diagnosis present

## 2019-07-14 DIAGNOSIS — R748 Abnormal levels of other serum enzymes: Secondary | ICD-10-CM | POA: Insufficient documentation

## 2019-07-14 LAB — URINALYSIS, ROUTINE W REFLEX MICROSCOPIC
Bilirubin Urine: NEGATIVE
Glucose, UA: NEGATIVE mg/dL
Hgb urine dipstick: NEGATIVE
Ketones, ur: NEGATIVE mg/dL
Leukocytes,Ua: NEGATIVE
Nitrite: NEGATIVE
Protein, ur: NEGATIVE mg/dL
Specific Gravity, Urine: 1.011 (ref 1.005–1.030)
pH: 6 (ref 5.0–8.0)

## 2019-07-14 LAB — CBC
HCT: 42.1 % (ref 39.0–52.0)
Hemoglobin: 13.9 g/dL (ref 13.0–17.0)
MCH: 31.2 pg (ref 26.0–34.0)
MCHC: 33 g/dL (ref 30.0–36.0)
MCV: 94.6 fL (ref 80.0–100.0)
Platelets: 251 10*3/uL (ref 150–400)
RBC: 4.45 MIL/uL (ref 4.22–5.81)
RDW: 12.1 % (ref 11.5–15.5)
WBC: 7.6 10*3/uL (ref 4.0–10.5)
nRBC: 0 % (ref 0.0–0.2)

## 2019-07-14 LAB — COMPREHENSIVE METABOLIC PANEL
ALT: 32 U/L (ref 0–44)
AST: 31 U/L (ref 15–41)
Albumin: 5.1 g/dL — ABNORMAL HIGH (ref 3.5–5.0)
Alkaline Phosphatase: 39 U/L (ref 38–126)
Anion gap: 7 (ref 5–15)
BUN: 13 mg/dL (ref 6–20)
CO2: 26 mmol/L (ref 22–32)
Calcium: 9.5 mg/dL (ref 8.9–10.3)
Chloride: 101 mmol/L (ref 98–111)
Creatinine, Ser: 1.25 mg/dL — ABNORMAL HIGH (ref 0.61–1.24)
GFR calc Af Amer: >60 mL/min (ref >60)
GFR calc non Af Amer: >60 mL/min (ref >60)
Glucose, Bld: 97 mg/dL (ref 70–99)
Potassium: 4.9 mmol/L (ref 3.5–5.1)
Sodium: 134 mmol/L — ABNORMAL LOW (ref 135–145)
Total Bilirubin: 0.5 mg/dL (ref 0.3–1.2)
Total Protein: 8.1 g/dL (ref 6.5–8.1)

## 2019-07-14 LAB — LIPASE, BLOOD: Lipase: 188 U/L — ABNORMAL HIGH (ref 11–51)

## 2019-07-14 MED ORDER — HYDROCODONE-ACETAMINOPHEN 5-325 MG PO TABS: 1.0000 | ORAL_TABLET | Freq: Four times a day (QID) | ORAL | 0 refills | Status: DC | PRN

## 2019-07-14 MED ORDER — ONDANSETRON HCL 4 MG/2ML IJ SOLN
4.0000 mg | Freq: Once | INTRAMUSCULAR | Status: AC
  Administered 2019-07-14: 18:00:00 4 mg via INTRAVENOUS
  Filled 2019-07-14: qty 2

## 2019-07-14 MED ORDER — MORPHINE SULFATE (PF) 4 MG/ML IV SOLN
4.0000 mg | Freq: Once | INTRAVENOUS | Status: AC
  Administered 2019-07-14: 18:00:00 4 mg via INTRAVENOUS
  Filled 2019-07-14: qty 1

## 2019-07-14 MED ORDER — IOHEXOL 300 MG/ML  SOLN
100.0000 mL | Freq: Once | INTRAMUSCULAR | Status: AC | PRN
  Administered 2019-07-14: 20:00:00 100 mL via INTRAVENOUS

## 2019-07-14 MED ORDER — SODIUM CHLORIDE 0.9 % IV BOLUS
1000.0000 mL | Freq: Once | INTRAVENOUS | Status: AC
  Administered 2019-07-14: 18:00:00 1000 mL via INTRAVENOUS

## 2019-07-14 MED ORDER — SODIUM CHLORIDE 0.9% FLUSH
3.0000 mL | Freq: Once | INTRAVENOUS | Status: AC
  Administered 2019-07-14: 20:00:00 3 mL via INTRAVENOUS

## 2019-07-14 MED ORDER — HYDROMORPHONE HCL 1 MG/ML IJ SOLN
1.0000 mg | Freq: Once | INTRAMUSCULAR | Status: AC
  Administered 2019-07-14: 1 mg via INTRAVENOUS
  Filled 2019-07-14: qty 1

## 2019-07-14 MED ORDER — ONDANSETRON 4 MG PO TBDP: 4.0000 mg | ORAL_TABLET | Freq: Three times a day (TID) | ORAL | 0 refills | Status: DC | PRN

## 2019-07-14 NOTE — Discharge Instructions (Addendum)
1. Medications: Start taking hydrocodone as needed for severe pain.  Do not drive, drink alcohol, or operate heavy machinery while taking this medication as it can make you drowsy.  It can also cause constipation so take with stool softener.  Take Zofran as needed for nausea.  Let this medicine dissolve under your tongue and wait around 20 minutes before eating or drinking after taking this medication. 2. Treatment: rest, drink plenty of fluids, advance diet slowly.  Eat a diet of very bland foods that will not upset your stomach, stick with mostly liquids such as broth, Pedialyte, water. 3. Follow Up: Please followup with your primary doctor or gastroenterologist within 2-3 days for discussion of your diagnoses and further evaluation after today's visit; if you do not have a primary care doctor use the resource guide provided to find one; Please return to the ER for persistent vomiting, high fevers, worsening pain, bruising around the abdomen/belly

## 2019-07-14 NOTE — ED Notes (Signed)
Awaiting CT  IV restart p pt complained of pain to IV site   Pt reports pain x several months   Followed at [facility]- was supposed to be being scheduled thru them for CT

## 2019-07-14 NOTE — ED Notes (Signed)
Pt drinking ginger ale  

## 2019-07-14 NOTE — ED Notes (Signed)
To CT

## 2019-07-14 NOTE — ED Notes (Signed)
Awaiting CT

## 2019-07-14 NOTE — ED Provider Notes (Signed)
[facility] EMERGENCY DEPARTMENT Provider Note   CSN: [id number] Arrival date & time: 07/14/19  1359     History   Chief Complaint Chief Complaint  Patient presents with   Abdominal Pain    HPI Dustin Small is a 48 y.o. male with history of hepatitis C and underwent treatment for this 5 years ago, hypertension, schizoaffective schizophrenia presenting for evaluation of acute onset, progressively worsening abdominal pain intermittently for the last 2 weeks.  He reports that he had urinary symptoms and was treated for UTI with Bactrim last week, completed the course with improvement in his symptoms but shortly thereafter developed abdominal distention/bloating and epigastric abdominal pain.  He reports the pain is constant, dull, with certain movements becomes sharp and will at times radiate to the right upper quadrant of the abdomen.  He notes some nausea but no vomiting.  Denies any urinary symptoms, diarrhea, or constipation at this time.  Has had no fevers, chills, chest pain, shortness of breath.  Has not tried anything for his symptoms.  No prior abdominal surgeries.  He is a non-smoker, denies recreational drug use or excessive alcohol intake.     The history is provided by the patient.    Past Medical History:  Diagnosis Date   Hepatitis C    states this was cured x 5years ago    High blood pressure    Schizo affective schizophrenia [facility])     Patient Active Problem List   Diagnosis Date Noted   Special screening for malignant neoplasms, colon 03/04/2019    History reviewed. No pertinent surgical history.      Home Medications    Prior to Admission medications   Medication Sig Start Date End Date Taking? Authorizing Provider  albuterol (PROVENTIL HFA;VENTOLIN HFA) 108 (90 BASE) MCG/ACT inhaler Inhale 1 puff into the lungs every 6 (six) hours as needed for wheezing or shortness of breath.    [provider]  HYDROcodone-acetaminophen (NORCO/VICODIN)  5-325 MG tablet Take 1 tablet by mouth every 6 (six) hours as needed for severe pain. 07/14/19   Casmira Cramer A, PA-C  lisinopril-hydrochlorothiazide (PRINZIDE,ZESTORETIC) 10-12.5 MG per tablet Take 1 tablet by mouth daily.    [provider]  ondansetron (ZOFRAN ODT) 4 MG disintegrating tablet Take 1 tablet (4 mg total) by mouth every 8 (eight) hours as needed for nausea or vomiting. 07/14/19   Brighid Koch A, PA-C  trazodone (DESYREL) 300 MG tablet Take 300 mg by mouth at bedtime.    [provider]    Family History History reviewed. No pertinent family history.  Social History Social History   Tobacco Use   Smoking status: Former Smoker   Smokeless tobacco: Never Used  Substance Use Topics   Alcohol use: No   Drug use: No     Allergies   Patient has no known allergies.   Review of Systems Review of Systems  Constitutional: Negative for chills and fever.  Respiratory: Negative for cough and shortness of breath.   Cardiovascular: Negative for chest pain.  Gastrointestinal: Positive for abdominal pain and nausea. Negative for constipation, diarrhea and vomiting.  Genitourinary: Negative for dysuria, frequency, hematuria and urgency.  All other systems reviewed and are negative.    Physical Exam Updated Vital Signs BP 129/77 (BP Location: Right Arm)    Pulse 82    Temp 97.6 F (36.4 C) (Temporal)    Resp 18    Ht  (1.727 m)    Wt 86.2 kg  SpO2 99%    BMI 28.89 kg/m   Physical Exam Vitals signs and nursing note reviewed.  Constitutional:      General: He is not in acute distress.    Appearance: He is well-developed.  HENT:     Head: Normocephalic and atraumatic.  Eyes:     General:        Right eye: No discharge.        Left eye: No discharge.     Conjunctiva/sclera: Conjunctivae normal.  Neck:     Vascular: No JVD.     Trachea: No tracheal deviation.  Cardiovascular:     Rate and Rhythm: Normal rate and regular rhythm.  Pulmonary:       Effort: Pulmonary effort is normal.     Breath sounds: Normal breath sounds.  Abdominal:     General: Abdomen is protuberant. Bowel sounds are increased. There is no distension.     Palpations: Abdomen is soft.     Tenderness: There is abdominal tenderness in the right upper quadrant and epigastric area. There is no right CVA tenderness, left CVA tenderness, guarding or rebound. Negative signs include Murphy's sign and Rovsing's sign.  Skin:    General: Skin is warm and dry.     Findings: No erythema.  Neurological:     Mental Status: He is alert.  Psychiatric:        Behavior: Behavior normal.      ED Treatments / Results  Labs (all labs ordered are listed, but only abnormal results are displayed) Labs Reviewed  LIPASE, BLOOD - Abnormal; Notable for the following components:      Result Value   Lipase 188 (*)    All other components within normal limits  COMPREHENSIVE METABOLIC PANEL - Abnormal; Notable for the following components:   Sodium 134 (*)    Creatinine, Ser 1.25 (*)    Albumin 5.1 (*)    All other components within normal limits  CBC  URINALYSIS, ROUTINE W REFLEX MICROSCOPIC    EKG None  Radiology Ct Abdomen Pelvis W Contrast  Result Date: 07/14/2019 CLINICAL DATA:  Upper abdominal pain and nausea EXAM: CT ABDOMEN AND PELVIS WITH CONTRAST TECHNIQUE: Multidetector CT imaging of the abdomen and pelvis was performed using the standard protocol following bolus administration of intravenous contrast. CONTRAST:  100mL OMNIPAQUE IOHEXOL 300 MG/ML  SOLN COMPARISON:  Ultrasound 02/21/2019 FINDINGS: Lower chest: Lung bases demonstrate no acute consolidation or effusion. The heart size is normal Hepatobiliary: No focal liver abnormality is seen. No gallstones, gallbladder wall thickening, or biliary dilatation. Pancreas: Unremarkable. No pancreatic ductal dilatation or surrounding inflammatory changes. Spleen: Normal in size without focal abnormality. Adrenals/Urinary  Tract: Adrenal glands are normal. Multiple intrarenal stones bilaterally. On the right, stones measure up to 6 mm in the lower pole. On the left, stones measure up to 6 mm in the lower pole. There is mild left hydronephrosis but no hydroureter. Bladder unremarkable Stomach/Bowel: Stomach is within normal limits. Appendix appears normal. No evidence of bowel wall thickening, distention, or inflammatory changes. Scattered sigmoid colon diverticula without acute inflammatory process Vascular/Lymphatic: No significant vascular findings are present. No enlarged abdominal or pelvic lymph nodes. Reproductive: Prostate is unremarkable. Other: No abdominal wall hernia or abnormality. No abdominopelvic ascites. Musculoskeletal: No acute or significant osseous findings. IMPRESSION: 1. No CT evidence for acute intra-abdominal or pelvic abnormality. 2. Multiple intrarenal stones bilaterally. Mild left hydronephrosis but no hydroureter, question UPJ configuration. 3. Sigmoid colon diverticular disease without acute inflammatory process Electronically  Signed   By: Donavan Foil M.D.   On: 07/14/2019 20:25    Procedures Procedures (including critical care time)  Medications Ordered in ED Medications  sodium chloride flush (NS) 0.9 % injection 3 mL (3 mLs Intravenous Given 07/14/19 1954)  morphine 4 MG/ML injection 4 mg (4 mg Intravenous Given 07/14/19 1752)  ondansetron (ZOFRAN) injection 4 mg (4 mg Intravenous Given 07/14/19 1752)  sodium chloride 0.9 % bolus 1,000 mL (0 mLs Intravenous Stopped 07/14/19 2203)  iohexol (OMNIPAQUE) 300 MG/ML solution 100 mL (100 mLs Intravenous Contrast Given 07/14/19 2009)  HYDROmorphone (DILAUDID) injection 1 mg (1 mg Intravenous Given 07/14/19 2055)     Initial Impression / Assessment and Plan / ED Course  I have reviewed the triage vital signs and the nursing notes.  Pertinent labs & imaging results that were available during my care of the patient were reviewed by me and  considered in my medical decision making (see chart for details).        Patient presenting for evaluation of acute onset, progressively worsening epigastric abdominal pain for 2 weeks.  Notes associated bloating and nausea.  He is afebrile, vital signs are stable.  No peritoneal signs on examination of the abdomen.  Has a history of hepatitis C which was treated 5 years ago.  Will obtain lab work and CT scan for further evaluation.  Labs reviewed by me show no leukocytosis, no anemia, no metabolic derangements.  Creatinine very mildly elevated above baseline but BUN within normal limits.  LFTs are within normal limits.  Lipase elevated at 188 suggesting possible pancreatitis.   CT shows no evidence of acute intra-abdominal pathology including obstruction, perforation, appendicitis, cholecystitis, or pancreatitis.  However with signs and symptoms of pancreatitis and elevated lipase, will treat for presumed pancreatitis.  He tells me he does not drink an excessive amount of alcohol and he does not have a history of diabetes.  Unsure of etiology of his elevated lipase.  No evidence of gallstone pancreatitis.  He had a right upper quadrant ultrasound earlier this year which was essentially normal.  He was given IV fluids, nausea medicine and pain control in the ED and on reevaluation he is resting comfortably reports he is feeling better.  Serial abdominal examinations are benign.  He is tolerating p.o. fluids without difficulty.  We discussed admission for pain control versus discharge home with symptomatic management and close GI follow-up which he already has due to his history of hepatitis C.  He states he would like to go home.  We discussed advancing diet slowly, mostly clear liquids to start.  Will discharge with small amount of hydrocodone for severe pain, advised of appropriate use of this medication.  [place] controlled substance registry was queried with no inconsistencies.  Recommend close  follow-up with his gastroenterologist.  Discussed strict ED return precautions. Pt verbalized understanding of and agreement with plan and is safe for discharge home at this time. Discussed with Dr. Laverta Baltimore who agrees with assessment and plan at this time.     Final Clinical Impressions(s) / ED Diagnoses   Final diagnoses:  Elevated lipase  Epigastric pain  Nausea    ED Discharge Orders         Ordered    HYDROcodone-acetaminophen (NORCO/VICODIN) 5-325 MG tablet  Every 6 hours PRN     07/14/19 2129    ondansetron (ZOFRAN ODT) 4 MG disintegrating tablet  Every 8 hours PRN     07/14/19 2129  Jeanie Sewer, PA-C 07/15/19 1214    Maia Plan, MD 07/15/19 1246

## 2019-07-14 NOTE — ED Triage Notes (Signed)
ABD pain, cramping and distention on and off for a few weeks

## 2020-02-09 ENCOUNTER — Encounter (INDEPENDENT_AMBULATORY_CARE_PROVIDER_SITE_OTHER): Payer: Self-pay | Admitting: Internal Medicine

## 2020-02-09 ENCOUNTER — Ambulatory Visit (INDEPENDENT_AMBULATORY_CARE_PROVIDER_SITE_OTHER): Payer: Medicaid Other | Admitting: Internal Medicine

## 2020-02-09 ENCOUNTER — Other Ambulatory Visit: Payer: Self-pay

## 2020-02-09 DIAGNOSIS — R109 Unspecified abdominal pain: Secondary | ICD-10-CM | POA: Diagnosis not present

## 2020-02-09 MED ORDER — PANTOPRAZOLE SODIUM 40 MG PO TBEC: 40.0000 mg | DELAYED_RELEASE_TABLET | Freq: Every day | ORAL | 5 refills | Status: AC

## 2020-02-09 MED ORDER — HYDROCODONE-ACETAMINOPHEN 5-325 MG PO TABS: 1.0000 | ORAL_TABLET | Freq: Three times a day (TID) | ORAL | 0 refills | Status: DC | PRN

## 2020-02-09 NOTE — Progress Notes (Signed)
Presenting complaint;  Abdominal pain  History of present illness.  Patient is 49 year old Caucasian male who is here for scheduled visit.  He has history of hepatitis C for which he was treated successfully by Dr. Doristine Mango of [facility] in 2017.  He was last seen in May 2020. He now presents with abdominal pain which he has had since October 2020.  Patient states he was seen in emergency room at [facility] on 07/14/2019 and he was told his pancreas was swollen.  He was treated and discharged.  He did not follow-up with his primary care physician.  He states he has been having this pain ever since but it has been mild and intermittent.  He noted symptomatic relief by staying on a bland diet.  However pain has increased in severity in the last 1 week.  He feels bloated and feels like he is going to explode.  He has had nausea but no vomiting.  He also denies fever chills melena or rectal bleeding diarrhea or constipation.  He says he has a very good appetite but he is afraid to eat as it makes the pain worse.  He has not lost any weight since his pain began.  He also complains of abdominal rumbling/gurgling.  He has been taking Alka-Seltzer on as-needed basis but not daily.  No history of hematuria. Patient patient states he has not had any alcohol since 2014.  Prior to that he drank socially but not daily.  He also denies using drugs.  Current Medications: Outpatient Encounter Medications as of 02/09/2020  Medication Sig  . albuterol (PROVENTIL HFA;VENTOLIN HFA) 108 (90 BASE) MCG/ACT inhaler Inhale 1 puff into the lungs every 6 (six) hours as needed for wheezing or shortness of breath.  . lisinopril-hydrochlorothiazide (PRINZIDE,ZESTORETIC) 10-12.5 MG per tablet Take 1 tablet by mouth daily.  . trazodone (DESYREL) 300 MG tablet Take 300 mg by mouth at bedtime.  Marland Kitchen HYDROcodone-acetaminophen (NORCO/VICODIN) 5-325 MG tablet Take 1 tablet by mouth every 6 (six) hours as needed for severe pain.  (Patient not taking: Reported on 02/09/2020)  . ondansetron (ZOFRAN ODT) 4 MG disintegrating tablet Take 1 tablet (4 mg total) by mouth every 8 (eight) hours as needed for nausea or vomiting. (Patient not taking: Reported on 02/09/2020)   No facility-administered encounter medications on file as of 02/09/2020.   Past medical history  Hypertension. COPD. History of hepatitis C treated with SVR in 2017.  HCVRNA negative in May 2019. History of right wrist/forearm fracture on 3 different occasions treated nonsurgically. History of kidney stone.  Seen on CT of October 2020.   Allergies  No Known Allergies  Family history  Mother had coronary artery disease and had undergone CABG.  She died of MI at age 54. Father had COPD and died at 27. He has 5 brothers 2 sisters living.  1 sister died of stomach cancer at age 81.  Social history  He is divorced.  He has 1 daughter age 77 in good health.  He worked in [place] for 15 years and now on disability.  He smokes cigarettes about a pack a day for 5 years but quit 20 years ago.  He used to drink alcohol socially but has not had any since 2014.  Physical examination.  Blood pressure (!) 149/97, pulse 76, temperature (!) 97.2 F (36.2 C), temperature source Temporal, height '5\' 8"'$  (1.727 m), weight 203 lb 3.2 oz (92.2 kg). Patient is alert and in no acute distress. He is wearing  facial mask. Conjunctiva is pink. Sclera is nonicteric Oropharyngeal mucosa is normal. He has complete upper and partial lower dental plates. No neck masses or thyromegaly noted. Cardiac exam with regular rhythm normal S1 and S2. No murmur or gallop noted. Lungs are clear to auscultation. Abdomen is full.  Bowel sounds are normal.  On palpation abdomen is soft.  He has mild to moderate tenderness in involving right upper quadrant right mid abdomen as well as epigastrium.  Tenderness appears to be both superficial and deep.  There is no skin rash.  No organomegaly or  masses. No LE edema or clubbing noted.  Labs/studies Results:  CBC Latest Ref Rng & Units 07/14/2019 02/18/2019 04/27/2014  WBC 4.0 - 10.5 K/uL 7.6 7.2 7.6  Hemoglobin 13.0 - 17.0 g/dL 13.9 13.5 14.1  Hematocrit 39.0 - 52.0 % 42.1 39.9 40.5  Platelets 150 - 400 K/uL 251 217 212    CMP Latest Ref Rng & Units 07/14/2019 02/18/2019 02/13/2018  Glucose 70 - 99 mg/dL 97 98 -  BUN 6 - 20 mg/dL 13 17 -  Creatinine 0.61 - 1.24 mg/dL 1.25(H) 1.20 -  Sodium 135 - 145 mmol/L 134(L) 138 -  Potassium 3.5 - 5.1 mmol/L 4.9 5.0 -  Chloride 98 - 111 mmol/L 101 104 -  CO2 22 - 32 mmol/L 26 27 -  Calcium 8.9 - 10.3 mg/dL 9.5 9.9 -  Total Protein 6.5 - 8.1 g/dL 8.1 7.8 8.0  Total Bilirubin 0.3 - 1.2 mg/dL 0.5 0.3 0.5  Alkaline Phos 38 - 126 U/L 39 - -  AST 15 - 41 U/L 31 27 25   ALT 0 - 44 U/L 32 32 19    Hepatic Function Latest Ref Rng & Units 07/14/2019 02/18/2019 02/13/2018  Total Protein 6.5 - 8.1 g/dL 8.1 7.8 8.0  Albumin 3.5 - 5.0 g/dL 5.1(H) - -  AST 15 - 41 U/L 31 27 25   ALT 0 - 44 U/L 32 32 19  Alk Phosphatase 38 - 126 U/L 39 - -  Total Bilirubin 0.3 - 1.2 mg/dL 0.5 0.3 0.5  Bilirubin, Direct 0.0 - 0.2 mg/dL - - 0.1    Serum lipase was 188(11-51) and 07/14/2019.  Ultrasound from 02/21/2019 - for gallstones and bile duct was 3 mm.  Abdominal pelvic CT from 07/14/2019 reviewed.  Sigmoid diverticulosis without diverticulitis. Normal appearance of liver bile duct gallbladder and pancreas.  Spleen within normal limits. Multiple intrarenal stones with mild left hydronephrosis.  Assessment:  #1.  Abdominal pain involving right upper and mid abdomen as well as epigastrium of 8 months duration with worsening over a week ago associated with nausea but no vomiting or weight loss.  Last evaluation was in October 2019 when he was seen in emergency room when his serum lipase was elevated but transaminases were normal and CT did not reveal anatomic changes to suggest pancreatitis.  He describes pain to  be severe and if this pain is due to pancreatitis 1 should be able to see biochemical and CT evidence of pancreatitis.  I am however not convinced that he has acute on chronic pancreatitis.  Some of this pain may be musculoskeletal.  #2.  History of hepatitis C genotype 1a treated successfully with Mavyret.  Pretreatment elastography revealed MetaWare score suggestive of F3 and F4 disease.  #3.  History of kidney stones.  He has bilateral nephrolithiasis and question of mild left hydronephrosis.  He will need to follow-up with [place] primary care physician regarding urolithiasis.  I do  not believe his pain is typical of renal colic.  Plan:  Patient advised not to take Alka-Seltzer. Patient will go to the lab for CBC, comprehensive chemistry panel, serum amylase and lipase. Abdominal pelvic CT with oral and IV contrast as soon as possible. Pantoprazole 40 mg by mouth 30 minutes before breakfast daily. Hydrocodone-acetaminophen 5-325 mg 1 tablet 3 times a day as needed.  Prescription given for 21 doses without refill. Further recommendations to follow.

## 2020-02-09 NOTE — Patient Instructions (Signed)
Do not take Alka-Seltzer or OTC NSAIDs as a Advil Aleve BC and Goody powder. Take pantoprazole 40 mg by mouth 30 minutes before breakfast daily. Use pain medicines only for moderate to severe pain on as-needed basis Physician will call with results of blood tests and CT scan when completed.

## 2020-02-16 ENCOUNTER — Telehealth (INDEPENDENT_AMBULATORY_CARE_PROVIDER_SITE_OTHER): Payer: Self-pay | Admitting: Internal Medicine

## 2020-02-16 LAB — COMPREHENSIVE METABOLIC PANEL
AG Ratio: 1.6 (calc) (ref 1.0–2.5)
ALT: 55 U/L — ABNORMAL HIGH (ref 9–46)
AST: 36 U/L — ABNORMAL HIGH (ref 10–35)
Albumin: 5 g/dL (ref 3.6–5.1)
Alkaline phosphatase (APISO): 38 U/L (ref 35–144)
BUN: 14 mg/dL (ref 7–25)
CO2: 28 mmol/L (ref 20–32)
Calcium: 10.4 mg/dL — ABNORMAL HIGH (ref 8.6–10.3)
Chloride: 103 mmol/L (ref 98–110)
Creat: 1.24 mg/dL (ref 0.70–1.33)
Globulin: 3.1 g/dL (calc) (ref 1.9–3.7)
Glucose, Bld: 100 mg/dL (ref 65–139)
Potassium: 4.6 mmol/L (ref 3.5–5.3)
Sodium: 138 mmol/L (ref 135–146)
Total Bilirubin: 0.4 mg/dL (ref 0.2–1.2)
Total Protein: 8.1 g/dL (ref 6.1–8.1)

## 2020-02-16 LAB — LIPASE: Lipase: 39 U/L (ref 7–60)

## 2020-02-16 LAB — CBC
HCT: 40.6 % (ref 38.5–50.0)
Hemoglobin: 13.5 g/dL (ref 13.2–17.1)
MCH: 31 pg (ref 27.0–33.0)
MCHC: 33.3 g/dL (ref 32.0–36.0)
MCV: 93.3 fL (ref 80.0–100.0)
MPV: 10 fL (ref 7.5–12.5)
Platelets: 231 10*3/uL (ref 140–400)
RBC: 4.35 10*6/uL (ref 4.20–5.80)
RDW: 12.3 % (ref 11.0–15.0)
WBC: 7 10*3/uL (ref 3.8–10.8)

## 2020-02-16 LAB — AMYLASE: Amylase: 45 U/L (ref 21–101)

## 2020-02-16 NOTE — Telephone Encounter (Signed)
Patient came by office stated he needs refill on pain medication - has only 2 left

## 2020-02-17 ENCOUNTER — Telehealth (INDEPENDENT_AMBULATORY_CARE_PROVIDER_SITE_OTHER): Payer: Self-pay | Admitting: *Deleted

## 2020-02-17 ENCOUNTER — Ambulatory Visit (INDEPENDENT_AMBULATORY_CARE_PROVIDER_SITE_OTHER): Payer: Medicaid Other | Admitting: Internal Medicine

## 2020-02-17 NOTE — Telephone Encounter (Signed)
Patient called in wanting pain meds, I explained Dr Renae Fickle and Babette Relic had been in clinic and she would get with Dr Karilyn Cota and let him know, he said bullshit and hung up on me. I made Dr Karilyn Cota aware and he said he would not be writing pain meds for patient,  he would need to get from primary care docor- I called patient to let him know and he told me Dr Karilyn Cota was his primary care doctor, I explained to him that Dr Karilyn Cota was a specialist not a  primary care doctor patient then hung up

## 2020-02-17 NOTE — Telephone Encounter (Signed)
Per Dr.Rehman the patient will need to call his PCP and or get the medication from pain management. Dustin Small has made the patient aware. Refer to her documentation.

## 2020-02-18 ENCOUNTER — Ambulatory Visit (HOSPITAL_COMMUNITY)
Admission: RE | Admit: 2020-02-18 | Discharge: 2020-02-18 | Disposition: A | Discharge status: Home / Self Care | Payer: Medicaid Other | Source: Ambulatory Visit | Attending: Internal Medicine | Admitting: Internal Medicine

## 2020-02-18 ENCOUNTER — Other Ambulatory Visit: Payer: Self-pay

## 2020-02-18 ENCOUNTER — Ambulatory Visit (INDEPENDENT_AMBULATORY_CARE_PROVIDER_SITE_OTHER): Payer: Medicaid Other | Admitting: Internal Medicine

## 2020-02-18 DIAGNOSIS — R109 Unspecified abdominal pain: Secondary | ICD-10-CM | POA: Insufficient documentation

## 2020-02-18 MED ORDER — IOHEXOL 300 MG/ML  SOLN
100.0000 mL | Freq: Once | INTRAMUSCULAR | Status: AC | PRN
  Administered 2020-02-18: 100 mL via INTRAVENOUS

## 2020-02-18 NOTE — Telephone Encounter (Signed)
I called patient today and given the results of blood tests and CT.  He does not have acute on chronic pancreatitis.  Suspect pain may be coming from kidney stones.  Patient advised to contact [facility].

## 2020-11-03 IMAGING — CT CT ABD-PELV W/ CM
2 of 5 series · 16 of 46 positions shown, 18 images · IV contrast (Omnipaque or Isovue)
Comparison: 07/14/2019

CLINICAL DATA: Worsening abdominal pain for 6 months.

EXAM:
CT ABDOMEN AND PELVIS WITH CONTRAST
TECHNIQUE: Multidetector CT imaging of the abdomen and pelvis was performed
using the standard protocol following bolus administration of
intravenous contrast.
CONTRAST:  100mL OMNIPAQUE IOHEXOL 300 MG/ML  SOLN

[Series 2: axial st · axial · 0.85mm/px · z∈[+387,+857]mm · 13 of 108 slices shown, 15 images]
[im 7/108  soft-tissue]
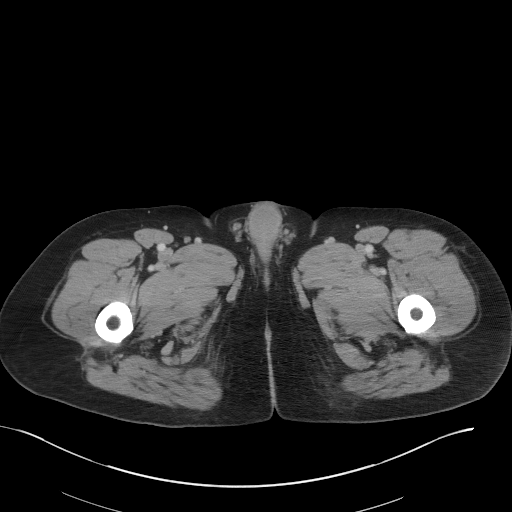
[im 7/108  bone]
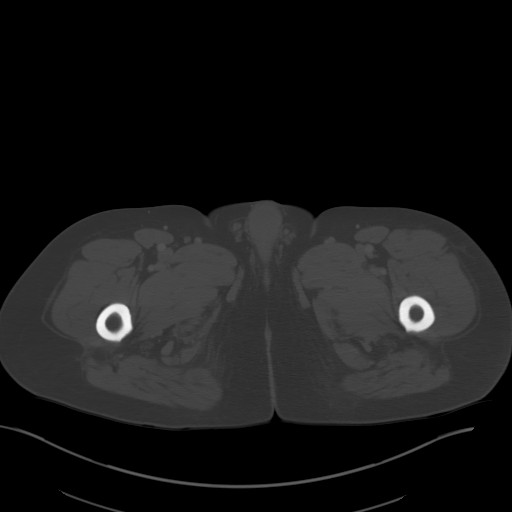
[im 13/108  soft-tissue]
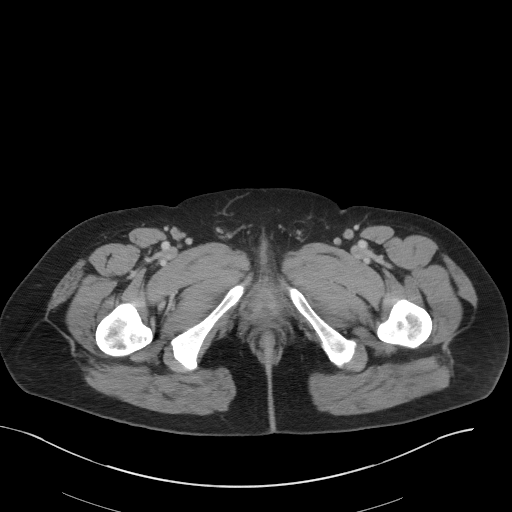
[im 26/108  soft-tissue]
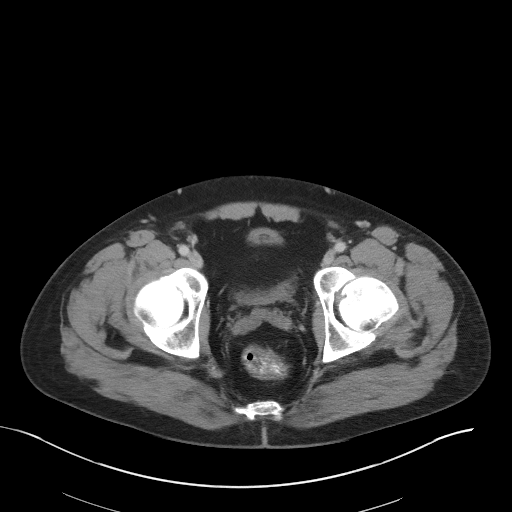
[im 32/108  soft-tissue]
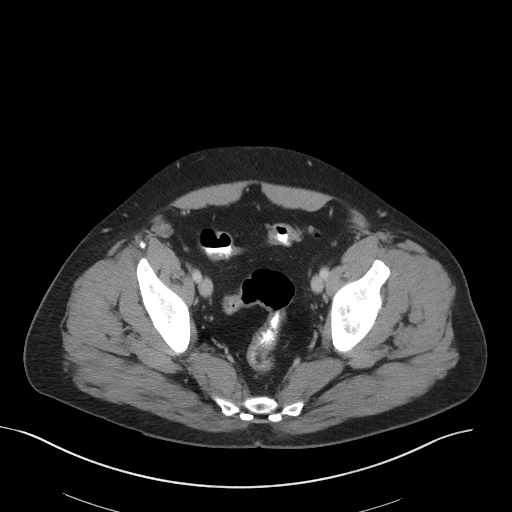
[im 38/108  soft-tissue]
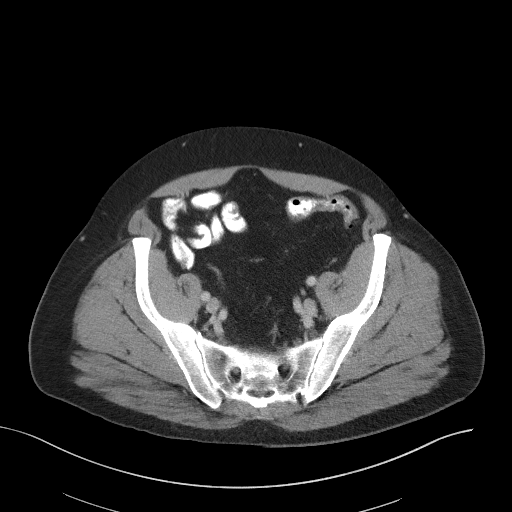
[im 45/108  soft-tissue]
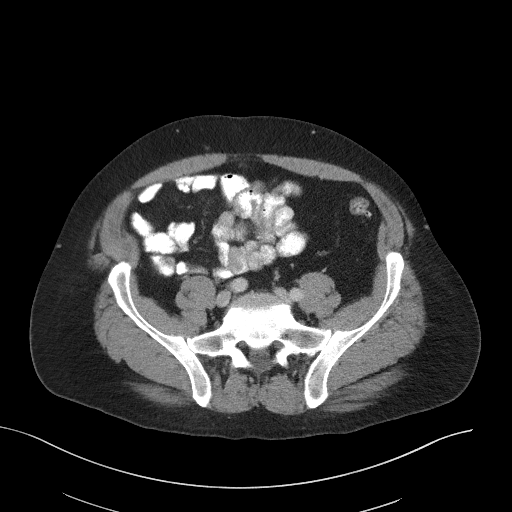
[im 57/108  soft-tissue]
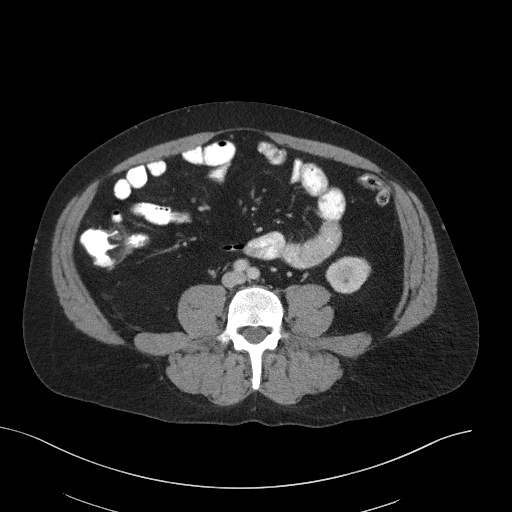
[im 63/108  soft-tissue]
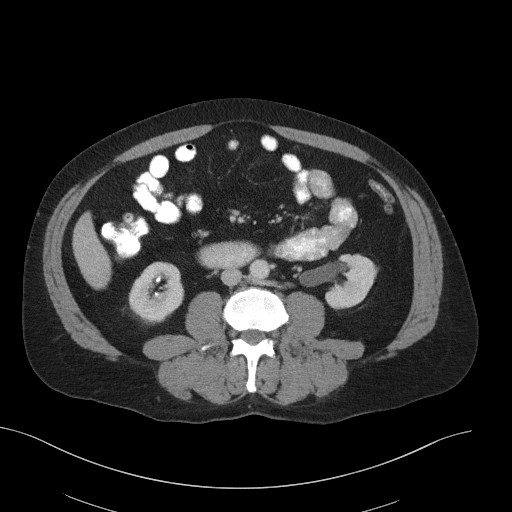
[im 70/108  soft-tissue]
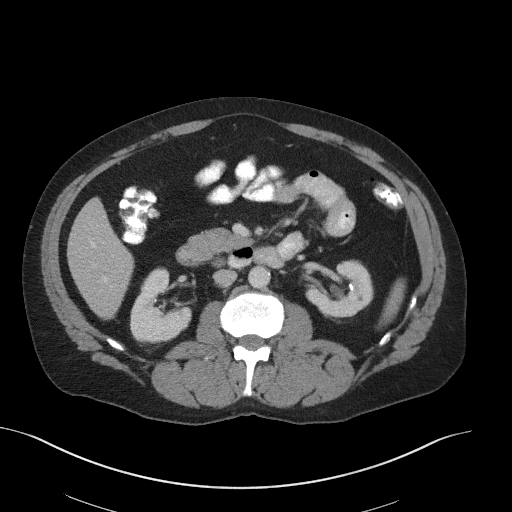
[im 70/108  bone]
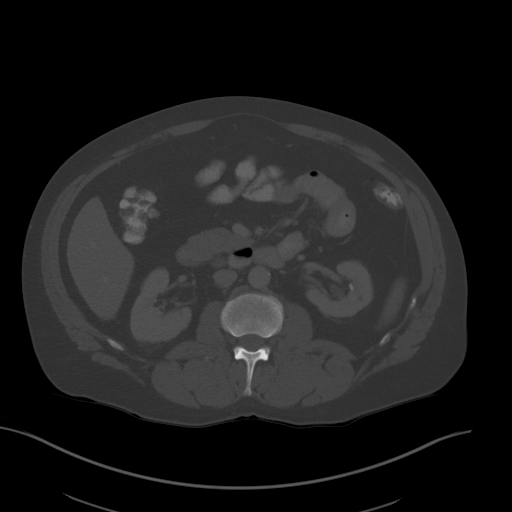
[im 76/108  soft-tissue]
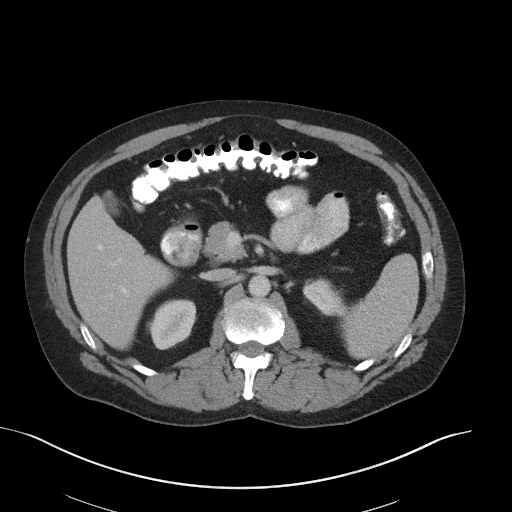
[im 82/108  soft-tissue]
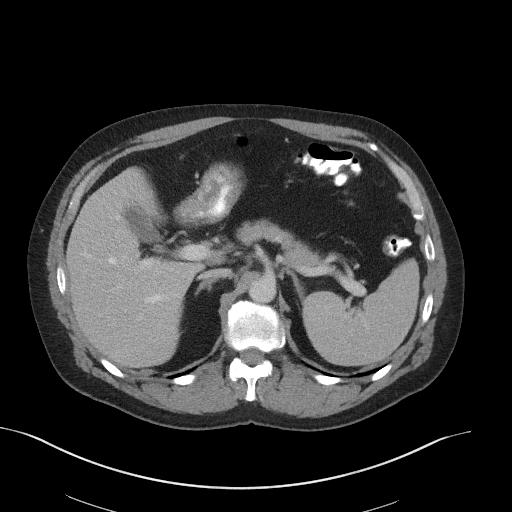
[im 95/108  soft-tissue]
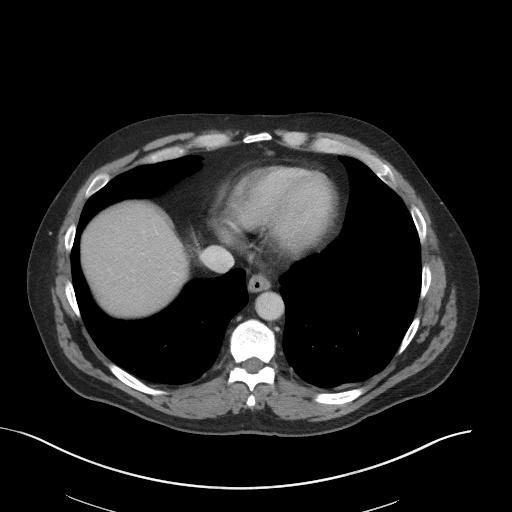
[im 101/108  soft-tissue]
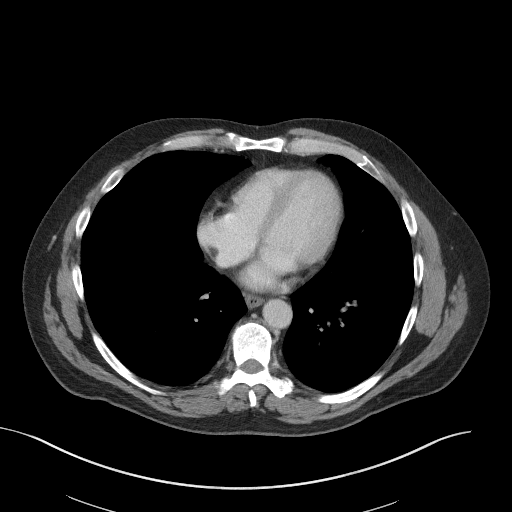

[Series 6: coronal st · coronal · 0.82mm/px · 3 of 117 slices shown]
[im 39/117  soft-tissue]
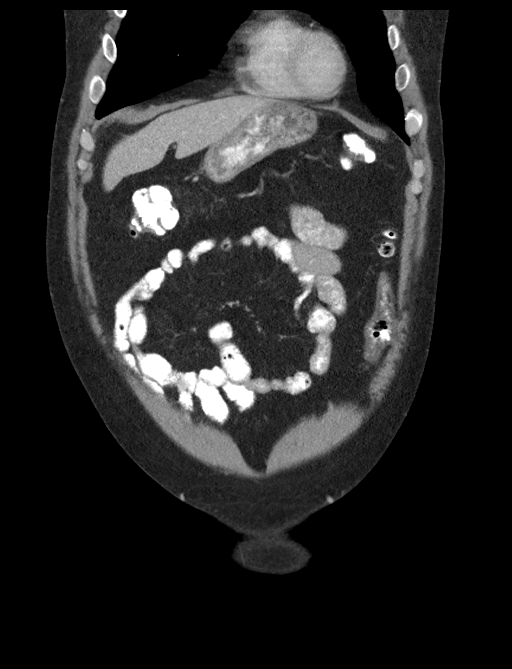
[im 52/117  soft-tissue]
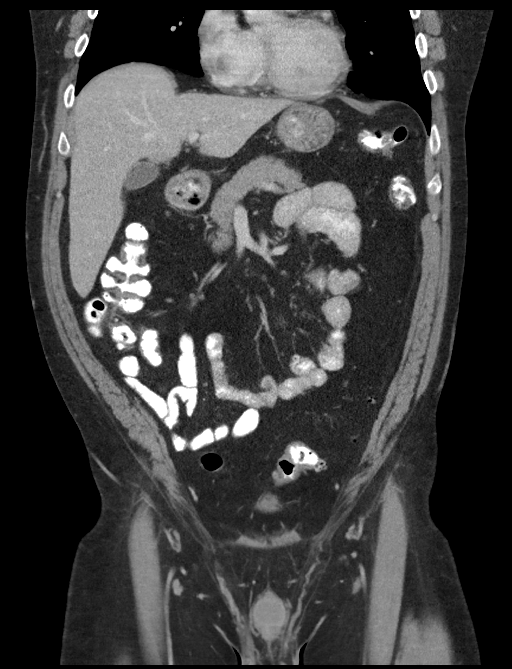
[im 65/117  soft-tissue]
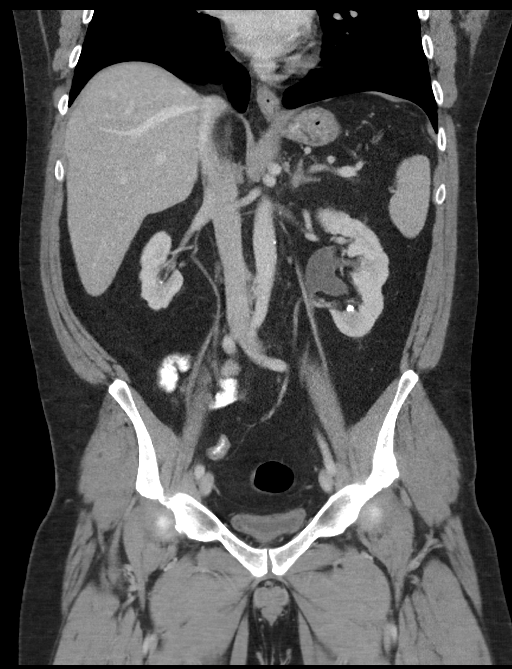

[16 of 46 positions shown; findings below may reference images not displayed]

FINDINGS: Lower Chest: No acute findings.

Hepatobiliary: No hepatic masses identified. Gallbladder is
unremarkable. No evidence of biliary ductal dilatation.

Pancreas:  No mass or inflammatory changes.

Spleen: Within normal limits in size and appearance.

Adrenals/Urinary Tract: Numerous small less than 1 cm renal calculi
are again seen bilaterally. Mild left renal pelvicaliectasis remains
stable. No evidence of ureteral calculi or dilatation. Urinary
bladder is nearly empty.

Stomach/Bowel: No evidence of obstruction, inflammatory process or
abnormal fluid collections. Diverticulosis is seen mainly involving
the descending and sigmoid colon, however there is no evidence of
diverticulitis. Normal appendix visualized.

Vascular/Lymphatic: No pathologically enlarged lymph nodes. No
abdominal aortic aneurysm.

Reproductive:  No mass or other significant abnormality.

Other:  None.

Musculoskeletal:  No suspicious bone lesions identified.
IMPRESSION: 1. Colonic diverticulosis, without radiographic evidence of
diverticulitis or other acute findings.
2. Bilateral nephrolithiasis.
3. Stable mild left renal pelvicaliectasis, without ureteral calculi
or dilatation. Low-grade left UPJ obstruction cannot be excluded.

## 2022-01-04 ENCOUNTER — Telehealth: Payer: Self-pay

## 2022-01-04 NOTE — Telephone Encounter (Signed)
Tried to call both numbers on file with no answer and no voicemail set up.  Will try to reach out at a later time.  ?

## 2022-01-04 NOTE — Telephone Encounter (Signed)
-----   Message from Sydnee Levans, [place] sent at 01/04/2022  4:30 PM EDT ----- ?The only information these medical records gives is an ultrasound report.  IF this patient is symptomatic he needs a STAT CT stone study.  As there are no surgeons available in our office to address this urgently this week, he should go to the emergency department if he is in pain, or has nausea, fever. Otherwise, we can see him next week.  ?----- Message ----- ?From: Karlton Lemon ?Sent: 01/04/2022   4:23 PM EDT ?To: Regan Rakers [place], PA-C ? ? ?

## 2022-01-09 ENCOUNTER — Ambulatory Visit (INDEPENDENT_AMBULATORY_CARE_PROVIDER_SITE_OTHER): Payer: Medicaid Other | Admitting: Urology

## 2022-01-09 ENCOUNTER — Encounter: Payer: Self-pay | Admitting: Urology

## 2022-01-09 ENCOUNTER — Ambulatory Visit (HOSPITAL_COMMUNITY)
Admission: RE | Admit: 2022-01-09 | Discharge: 2022-01-09 | Disposition: A | Discharge status: Home / Self Care | Payer: Medicaid Other | Source: Ambulatory Visit | Attending: Urology | Admitting: Urology

## 2022-01-09 VITALS — BP 146/98 | HR 84 | Ht 67.5 in | Wt 200.0 lb

## 2022-01-09 DIAGNOSIS — N200 Calculus of kidney: Secondary | ICD-10-CM

## 2022-01-09 DIAGNOSIS — R1011 Right upper quadrant pain: Secondary | ICD-10-CM | POA: Insufficient documentation

## 2022-01-09 MED ORDER — HYDROCODONE-ACETAMINOPHEN 5-325 MG PO TABS: 1.0000 | ORAL_TABLET | ORAL | 0 refills | Status: AC | PRN

## 2022-01-09 MED ORDER — HYDROCODONE-ACETAMINOPHEN 5-325 MG PO TABS: 1.0000 | ORAL_TABLET | ORAL | 0 refills | Status: DC | PRN

## 2022-01-09 NOTE — Patient Instructions (Signed)
Dietary Guidelines to Help Prevent Kidney Stones Kidney stones are deposits of minerals and salts that form inside your kidneys. Your risk of developing kidney stones may be greater depending on your diet, your lifestyle, the medicines you take, and whether you have certain medical conditions. Most people can lower their chances of developing kidney stones by following the instructions below. Your dietitian may give you more specific instructions depending on your overall health and the type of kidney stones you tend to develop. What are tips for following this plan? Reading food labels  Choose foods with "no salt added" or "low-salt" labels. Limit your salt (sodium) intake to less than 1,500 mg a day. Choose foods with calcium for each meal and snack. Try to eat about 300 mg of calcium at each meal. Foods that contain 200-500 mg of calcium a serving include: 8 oz (237 mL) of milk, calcium-fortifiednon-dairy milk, and calcium-fortifiedfruit juice. Calcium-fortified means that calcium has been added to these drinks. 8 oz (237 mL) of kefir, yogurt, and soy yogurt. 4 oz (114 g) of tofu. 1 oz (28 g) of cheese. 1 cup (150 g) of dried figs. 1 cup (91 g) of cooked broccoli. One 3 oz (85 g) can of sardines or mackerel. Most people need 1,000-1,500 mg of calcium a day. Talk to your dietitian about how much calcium is recommended for you. Shopping Buy plenty of fresh fruits and vegetables. Most people do not need to avoid fruits and vegetables, even if these foods contain nutrients that may contribute to kidney stones. When shopping for convenience foods, choose: Whole pieces of fruit. Pre-made salads with dressing on the side. Low-fat fruit and yogurt smoothies. Avoid buying frozen meals or prepared deli foods. These can be high in sodium. Look for foods with live cultures, such as yogurt and kefir. Choose high-fiber grains, such as whole-wheat breads, oat bran, and wheat cereals. Cooking Do not add  salt to food when cooking. Place a salt shaker on the table and allow each person to add his or her own salt to taste. Use vegetable protein, such as beans, textured vegetable protein (TVP), or tofu, instead of meat in pasta, casseroles, and soups. Meal planning Eat less salt, if told by your dietitian. To do this: Avoid eating processed or pre-made food. Avoid eating fast food. Eat less animal protein, including cheese, meat, poultry, or fish, if told by your dietitian. To do this: Limit the number of times you have meat, poultry, fish, or cheese each week. Eat a diet free of meat at least 2 days a week. Eat only one serving each day of meat, poultry, fish, or seafood. When you prepare animal protein, cut pieces into small portion sizes. For most meat and fish, one serving is about the size of the palm of your hand. Eat at least five servings of fresh fruits and vegetables each day. To do this: Keep fruits and vegetables on hand for snacks. Eat one piece of fruit or a handful of berries with breakfast. Have a salad and fruit at lunch. Have two kinds of vegetables at dinner. Limit foods that are high in a substance called oxalate. These include: Spinach (cooked), rhubarb, beets, sweet potatoes, and Swiss chard. Peanuts. Potato chips, french fries, and baked potatoes with skin on. Nuts and nut products. Chocolate. If you regularly take a diuretic medicine, make sure to eat at least 1 or 2 servings of fruits or vegetables that are high in potassium each day. These include: Avocado. Banana. Orange, prune,   carrot, or tomato juice. Baked potato. Cabbage. Beans and split peas. Lifestyle  Drink enough fluid to keep your urine pale yellow. This is the most important thing you can do. Spread your fluid intake throughout the day. If you drink alcohol: Limit how much you use to: 0-1 drink a day for women who are not pregnant. 0-2 drinks a day for men. Be aware of how much alcohol is in your  drink. In the [place] one drink equals one 12 oz bottle of beer (355 mL), one 5 oz glass of wine (148 mL), or one 1 oz glass of hard liquor (44 mL). Lose weight if told by your health care provider. Work with your dietitian to find an eating plan and weight loss strategies that work best for you. General information Talk to your health care provider and dietitian about taking daily supplements. You may be told the following depending on your health and the cause of your kidney stones: Not to take supplements with vitamin C. To take a calcium supplement. To take a daily probiotic supplement. To take other supplements such as magnesium, fish oil, or vitamin B6. Take over-the-counter and prescription medicines only as told by your health care provider. These include supplements. What foods should I limit? Limit your intake of the following foods, or eat them as told by your dietitian. Vegetables Spinach. Rhubarb. Beets. Canned vegetables. Pickles. Olives. Baked potatoes with skin. Grains Wheat bran. Baked goods. Salted crackers. Cereals high in sugar. Meats and other proteins Nuts. Nut butters. Large portions of meat, poultry, or fish. Salted, precooked, or cured meats, such as sausages, meat loaves, and hot dogs. Dairy Cheese. Beverages Regular soft drinks. Regular vegetable juice. Seasonings and condiments Seasoning blends with salt. Salad dressings. Soy sauce. Ketchup. Barbecue sauce. Other foods Canned soups. Canned pasta sauce. Casseroles. Pizza. Lasagna. Frozen meals. Potato chips. French fries. The items listed above may not be a complete list of foods and beverages you should limit. Contact a dietitian for more information. What foods should I avoid? Talk to your dietitian about specific foods you should avoid based on the type of kidney stones you have and your overall health. Fruits Grapefruit. The item listed above may not be a complete list of foods and beverages you should  avoid. Contact a dietitian for more information. Summary Kidney stones are deposits of minerals and salts that form inside your kidneys. You can lower your risk of kidney stones by making changes to your diet. The most important thing you can do is drink enough fluid. Drink enough fluid to keep your urine pale yellow. Talk to your dietitian about how much calcium you should have each day, and eat less salt and animal protein as told by your dietitian. This information is not intended to replace advice given to you by your health care provider. Make sure you discuss any questions you have with your health care provider. Document Revised: 05/23/2021 Document Reviewed: 05/23/2021 Elsevier Patient Education  2023 [employer] Inc[employer]  

## 2022-01-09 NOTE — H&P (View-Only) (Signed)
? ?01/09/2022 ?9:05 AM  ? ?Dustin Small ?03/06/1971 ?[id number] ? ?Referring provider: [facility] ?[address] ?[place],  [place] [id number] ? ?Right flank pain ? ? ?HPI: ?Dustin Small is a 51yo here for evaluation of nephrolithiasis. He has a 40 year stone history. First stone event was age 13-14. He passes 1-2 stones per year and his last stone event was 1 year ago. 2 weeks ago he developed sharp, intermittent mild to moderate right flank pain with associated nausea but no vomiting. NO LUTS. No fevers. CT stone study shows bilateral renal calculi with worse stone burden on the left.  ? ? ?PMH: ?Past Medical History:  ?Diagnosis Date  ? Hepatitis C   ? states this was cured x 5years ago   ? High blood pressure   ? Schizo affective schizophrenia (HCC)   ? ? ?Surgical History: ?No past surgical history on file. ? ?Home Medications:  ?Allergies as of 01/09/2022   ? ?   Reactions  ? No Known Allergies   ? ?  ? ?  ?Medication List  ?  ? ?  ? Accurate as of January 09, 2022  9:05 AM. If you have any questions, ask your nurse or doctor.  ?  ?  ? ?  ? ?albuterol 108 (90 Base) MCG/ACT inhaler ?Commonly known as: VENTOLIN HFA ?Inhale 1 puff into the lungs every 6 (six) hours as needed for wheezing or shortness of breath. ?  ?HYDROcodone-acetaminophen 5-325 MG tablet ?Commonly known as: NORCO/VICODIN ?Take 1 tablet by mouth 3 (three) times daily as needed for severe pain. ?  ?hyoscyamine 0.125 MG SL tablet ?Commonly known as: LEVSIN SL ?SMARTSIG:1 Tablet(s) Sublingual Every 6-8 Hours PRN ?  ?lisinopril-hydrochlorothiazide 10-12.5 MG tablet ?Commonly known as: ZESTORETIC ?Take 1 tablet by mouth daily. ?  ?ondansetron 4 MG disintegrating tablet ?Commonly known as: Zofran ODT ?Take 1 tablet (4 mg total) by mouth every 8 (eight) hours as needed for nausea or vomiting. ?  ?pantoprazole 40 MG tablet ?Commonly known as: PROTONIX ?Take 1 tablet (40 mg total) by mouth daily before breakfast. ?  ?risperiDONE 3 MG tablet ?Commonly  known as: RISPERDAL ?Take 3 mg by mouth 2 (two) times daily. ?  ?trazodone 300 MG tablet ?Commonly known as: DESYREL ?Take 300 mg by mouth at bedtime. ?  ? ?  ? ? ?Allergies:  ?Allergies  ?Allergen Reactions  ? No Known Allergies   ? ? ?Family History: ?No family history on file. ? ?Social History:  reports that he quit smoking about 21 years ago. He has never used smokeless tobacco. He reports that he does not drink alcohol and does not use drugs. ? ?ROS: ?All other review of systems were reviewed and are negative except what is noted above in HPI ? ?Physical Exam: ?BP (!) 146/98   Pulse 84   ?Constitutional:  Alert and oriented, No acute distress. ?HEENT: [place] AT, moist mucus membranes.  Trachea midline, no masses. ?Cardiovascular: No clubbing, cyanosis, or edema. ?Respiratory: Normal respiratory effort, no increased work of breathing. ?GI: Abdomen is soft, nontender, nondistended, no abdominal masses ?GU: No CVA tenderness.  ?Lymph: No cervical or inguinal lymphadenopathy. ?Skin: No rashes, bruises or suspicious lesions. ?Neurologic: Grossly intact, no focal deficits, moving all 4 extremities. ?Psychiatric: Normal mood and affect. ? ?Laboratory Data: ?Lab Results  ?Component Value Date  ? WBC 7.0 02/16/2020  ? HGB 13.5 02/16/2020  ? HCT 40.6 02/16/2020  ? MCV 93.3 02/16/2020  ? PLT 231 02/16/2020  ? ? ?  Lab Results  ?Component Value Date  ? CREATININE 1.24 02/16/2020  ? ? ?No results found for: PSA ? ?No results found for: TESTOSTERONE ? ?No results found for: HGBA1C ? ?Urinalysis ?   ?Component Value Date/Time  ? COLORURINE YELLOW 07/14/2019 1455  ? APPEARANCEUR CLEAR 07/14/2019 1455  ? LABSPEC 1.011 07/14/2019 1455  ? PHURINE 6.0 07/14/2019 1455  ? GLUCOSEU NEGATIVE 07/14/2019 1455  ? HGBUR NEGATIVE 07/14/2019 1455  ? BILIRUBINUR NEGATIVE 07/14/2019 1455  ? KETONESUR NEGATIVE 07/14/2019 1455  ? PROTEINUR NEGATIVE 07/14/2019 1455  ? NITRITE NEGATIVE 07/14/2019 1455  ? LEUKOCYTESUR NEGATIVE 07/14/2019 1455  ? ? ?No  results found for: LABMICR, WBCUA, RBCUA, LABEPIT, MUCUS, BACTERIA ? ?Pertinent Imaging: ?Ct stone study today: Images reviewed and discussed with the patient  ?No results found for this or any previous visit. ? ?No results found for this or any previous visit. ? ?No results found for this or any previous visit. ? ?No results found for this or any previous visit. ? ?No results found for this or any previous visit. ? ?No results found for this or any previous visit. ? ?No results found for this or any previous visit. ? ?No results found for this or any previous visit. ? ? ?Assessment & Plan:   ? ?1. Kidney stones ?-We discussed the management of kidney stones. These options include observation, ureteroscopy, shockwave lithotripsy (ESWL) and percutaneous nephrolithotomy (PCNL). We discussed which options are relevant to the patient's stone(s). We discussed the natural history of kidney stones as well as the complications of untreated stones and the impact on quality of life without treatment as well as with each of the above listed treatments. We also discussed the efficacy of each treatment in its ability to clear the stone burden. With any of these management options I discussed the signs and symptoms of infection and the need for emergent treatment should these be experienced. For each option we discussed the ability of each procedure to clear the patient of their stone burden.  ? ?For observation I described the risks which include but are not limited to silent renal damage, life-threatening infection, need for emergent surgery, failure to pass stone and pain.  ? ?For ureteroscopy I described the risks which include bleeding, infection, damage to contiguous structures, positioning injury, ureteral stricture, ureteral avulsion, ureteral injury, need for prolonged ureteral stent, inability to perform ureteroscopy, need for an interval procedure, inability to clear stone burden, stent discomfort/pain, heart attack,  stroke, pulmonary embolus and the inherent risks with general anesthesia.  ? ?For shockwave lithotripsy I described the risks which include arrhythmia, kidney contusion, kidney hemorrhage, need for transfusion, pain, inability to adequately break up stone, inability to pass stone fragments, Steinstrasse, infection associated with obstructing stones, need for alternate surgical procedure, need for repeat shockwave lithotripsy, MI, CVA, PE and the inherent risks with anesthesia/conscious sedation.  ? ?For PCNL I described the risks including positioning injury, pneumothorax, hydrothorax, need for chest tube, inability to clear stone burden, renal laceration, arterial venous fistula or malformation, need for embolization of kidney, loss of kidney or renal function, need for repeat procedure, need for prolonged nephrostomy tube, ureteral avulsion, MI, CVA, PE and the inherent risks of general anesthesia.  ? ?- The patient would like to proceed with right ureteroscopic stone extraction followed by left ureteroscopic stone extraction ?- Urinalysis, Routine w reflex microscopic ? ? ? ?No follow-ups on file. ? ?Wilkie Aye, MD ? ?[facility] ?  ?

## 2022-01-09 NOTE — Progress Notes (Signed)
? ?01/09/2022 ?9:05 AM  ? ?Dustin Small ?09-Oct-1970 ?[id number] ? ?Referring provider: [facility] ?[address] ?[place],  [place] [address] ? ?Right flank pain ? ? ?HPI: ?Dustin Small is a 51yo here for evaluation of nephrolithiasis. He has a 40 year stone history. First stone event was age 25-14. He passes 1-2 stones per year and his last stone event was 1 year ago. 2 weeks ago he developed sharp, intermittent mild to moderate right flank pain with associated nausea but no vomiting. NO LUTS. No fevers. CT stone study shows bilateral renal calculi with worse stone burden on the left.  ? ? ?PMH: ?Past Medical History:  ?Diagnosis Date  ? Hepatitis C   ? states this was cured x 5years ago   ? High blood pressure   ? Schizo affective schizophrenia (HCC)   ? ? ?Surgical History: ?No past surgical history on file. ? ?Home Medications:  ?Allergies as of 01/09/2022   ? ?   Reactions  ? No Known Allergies   ? ?  ? ?  ?Medication List  ?  ? ?  ? Accurate as of January 09, 2022  9:05 AM. If you have any questions, ask your nurse or doctor.  ?  ?  ? ?  ? ?albuterol 108 (90 Base) MCG/ACT inhaler ?Commonly known as: VENTOLIN HFA ?Inhale 1 puff into the lungs every 6 (six) hours as needed for wheezing or shortness of breath. ?  ?HYDROcodone-acetaminophen 5-325 MG tablet ?Commonly known as: NORCO/VICODIN ?Take 1 tablet by mouth 3 (three) times daily as needed for severe pain. ?  ?hyoscyamine 0.125 MG SL tablet ?Commonly known as: LEVSIN SL ?SMARTSIG:1 Tablet(s) Sublingual Every 6-8 Hours PRN ?  ?lisinopril-hydrochlorothiazide 10-12.5 MG tablet ?Commonly known as: ZESTORETIC ?Take 1 tablet by mouth daily. ?  ?ondansetron 4 MG disintegrating tablet ?Commonly known as: Zofran ODT ?Take 1 tablet (4 mg total) by mouth every 8 (eight) hours as needed for nausea or vomiting. ?  ?pantoprazole 40 MG tablet ?Commonly known as: PROTONIX ?Take 1 tablet (40 mg total) by mouth daily before breakfast. ?  ?risperiDONE 3 MG tablet ?Commonly  known as: RISPERDAL ?Take 3 mg by mouth 2 (two) times daily. ?  ?trazodone 300 MG tablet ?Commonly known as: DESYREL ?Take 300 mg by mouth at bedtime. ?  ? ?  ? ? ?Allergies:  ?Allergies  ?Allergen Reactions  ? No Known Allergies   ? ? ?Family History: ?No family history on file. ? ?Social History:  reports that he quit smoking about 21 years ago. He has never used smokeless tobacco. He reports that he does not drink alcohol and does not use drugs. ? ?ROS: ?All other review of systems were reviewed and are negative except what is noted above in HPI ? ?Physical Exam: ?BP (!) 146/98   Pulse 84   ?Constitutional:  Alert and oriented, No acute distress. ?HEENT: [place] AT, moist mucus membranes.  Trachea midline, no masses. ?Cardiovascular: No clubbing, cyanosis, or edema. ?Respiratory: Normal respiratory effort, no increased work of breathing. ?GI: Abdomen is soft, nontender, nondistended, no abdominal masses ?GU: No CVA tenderness.  ?Lymph: No cervical or inguinal lymphadenopathy. ?Skin: No rashes, bruises or suspicious lesions. ?Neurologic: Grossly intact, no focal deficits, moving all 4 extremities. ?Psychiatric: Normal mood and affect. ? ?Laboratory Data: ?Lab Results  ?Component Value Date  ? WBC 7.0 02/16/2020  ? HGB 13.5 02/16/2020  ? HCT 40.6 02/16/2020  ? MCV 93.3 02/16/2020  ? PLT 231 02/16/2020  ? ? ?  Lab Results  ?Component Value Date  ? CREATININE 1.24 02/16/2020  ? ? ?No results found for: PSA ? ?No results found for: TESTOSTERONE ? ?No results found for: HGBA1C ? ?Urinalysis ?   ?Component Value Date/Time  ? COLORURINE YELLOW 07/14/2019 1455  ? APPEARANCEUR CLEAR 07/14/2019 1455  ? LABSPEC 1.011 07/14/2019 1455  ? PHURINE 6.0 07/14/2019 1455  ? GLUCOSEU NEGATIVE 07/14/2019 1455  ? HGBUR NEGATIVE 07/14/2019 1455  ? BILIRUBINUR NEGATIVE 07/14/2019 1455  ? KETONESUR NEGATIVE 07/14/2019 1455  ? PROTEINUR NEGATIVE 07/14/2019 1455  ? NITRITE NEGATIVE 07/14/2019 1455  ? LEUKOCYTESUR NEGATIVE 07/14/2019 1455  ? ? ?No  results found for: LABMICR, WBCUA, RBCUA, LABEPIT, MUCUS, BACTERIA ? ?Pertinent Imaging: ?Ct stone study today: Images reviewed and discussed with the patient  ?No results found for this or any previous visit. ? ?No results found for this or any previous visit. ? ?No results found for this or any previous visit. ? ?No results found for this or any previous visit. ? ?No results found for this or any previous visit. ? ?No results found for this or any previous visit. ? ?No results found for this or any previous visit. ? ?No results found for this or any previous visit. ? ? ?Assessment & Plan:   ? ?1. Kidney stones ?-We discussed the management of kidney stones. These options include observation, ureteroscopy, shockwave lithotripsy (ESWL) and percutaneous nephrolithotomy (PCNL). We discussed which options are relevant to the patient's stone(s). We discussed the natural history of kidney stones as well as the complications of untreated stones and the impact on quality of life without treatment as well as with each of the above listed treatments. We also discussed the efficacy of each treatment in its ability to clear the stone burden. With any of these management options I discussed the signs and symptoms of infection and the need for emergent treatment should these be experienced. For each option we discussed the ability of each procedure to clear the patient of their stone burden.  ? ?For observation I described the risks which include but are not limited to silent renal damage, life-threatening infection, need for emergent surgery, failure to pass stone and pain.  ? ?For ureteroscopy I described the risks which include bleeding, infection, damage to contiguous structures, positioning injury, ureteral stricture, ureteral avulsion, ureteral injury, need for prolonged ureteral stent, inability to perform ureteroscopy, need for an interval procedure, inability to clear stone burden, stent discomfort/pain, heart attack,  stroke, pulmonary embolus and the inherent risks with general anesthesia.  ? ?For shockwave lithotripsy I described the risks which include arrhythmia, kidney contusion, kidney hemorrhage, need for transfusion, pain, inability to adequately break up stone, inability to pass stone fragments, Steinstrasse, infection associated with obstructing stones, need for alternate surgical procedure, need for repeat shockwave lithotripsy, MI, CVA, PE and the inherent risks with anesthesia/conscious sedation.  ? ?For PCNL I described the risks including positioning injury, pneumothorax, hydrothorax, need for chest tube, inability to clear stone burden, renal laceration, arterial venous fistula or malformation, need for embolization of kidney, loss of kidney or renal function, need for repeat procedure, need for prolonged nephrostomy tube, ureteral avulsion, MI, CVA, PE and the inherent risks of general anesthesia.  ? ?- The patient would like to proceed with right ureteroscopic stone extraction followed by left ureteroscopic stone extraction ?- Urinalysis, Routine w reflex microscopic ? ? ? ?No follow-ups on file. ? ?Wilkie Aye, MD ? ?[facility] [place] ?  ?

## 2022-01-10 LAB — URINALYSIS, ROUTINE W REFLEX MICROSCOPIC
Bilirubin, UA: NEGATIVE
Glucose, UA: NEGATIVE
Ketones, UA: NEGATIVE
Leukocytes,UA: NEGATIVE
Nitrite, UA: NEGATIVE
Protein,UA: NEGATIVE
RBC, UA: NEGATIVE
Specific Gravity, UA: 1.02 (ref 1.005–1.030)
Urobilinogen, Ur: 0.2 mg/dL (ref 0.2–1.0)
pH, UA: 6.5 (ref 5.0–7.5)

## 2022-01-18 NOTE — Patient Instructions (Signed)
? ? ? ? ? ? ? ? Dustin Small ? 01/18/2022  ?  ? @PREFPERIOPPHARMACY @ ? ? Your procedure is scheduled on  01/23/2022. ? ? Report to 03/25/2022 at  1215  P.M. ? ? Call this number if you have problems the morning of surgery: ? [phone] ? ? Remember: ? Do not eat or drink after midnight. ?  ? ?  Use your inhaler before you come and bring your rescue inhaler with you. ? ?  ? Take these medicines the morning of surgery with A SIP OF WATER  ? ?              hydrocodone (if needed), protonix, risperdal. ?  ? ? Do not wear jewelry, make-up or nail polish. ? Do not wear lotions, powders, or perfumes, or deodorant. ? Do not shave 48 hours prior to surgery.  Men may shave face and neck. ? Do not bring valuables to the hospital. ? [facility] is not responsible for any belongings or valuables. ? ?Contacts, dentures or bridgework may not be worn into surgery.  Leave your suitcase in the car.  After surgery it may be brought to your room. ? ?For patients admitted to the hospital, discharge time will be determined by your treatment team. ? ?Patients discharged the day of surgery will not be allowed to drive home and must have someone with them for 24 hours.  ? ? ?Special instructions:   DO NOT smoke tobacco or vape for 24 hours before your procedure. ? ?Please read over the following fact sheets that you were given. ?Coughing and Deep Breathing, Surgical Site Infection Prevention, Anesthesia Post-op Instructions, and Care and Recovery After Surgery ?  ? ? ? Ureteral Stent Implantation, Care After ?This sheet gives you information about how to care for yourself after your procedure. Your health care provider may also give you more specific instructions. If you have problems or questions, contact your health care provider. ?What can I expect after the procedure? ?After the procedure, it is common to have: ?Nausea. ?Mild pain when you urinate. You may feel this pain in your lower back or lower abdomen. The pain should stop within  a few minutes after you urinate. This may last for up to 1 week. ?A small amount of blood in your urine for several days. ?Follow these instructions at home: ?Medicines ?Take over-the-counter and prescription medicines only as told by your health care provider. ?If you were prescribed an antibiotic medicine, take it as told by your health care provider. Do not stop taking the antibiotic even if you start to feel better. ?Do not drive for 24 hours if you were given a sedative during your procedure. ?Ask your health care provider if the medicine prescribed to you requires you to avoid driving or using heavy machinery. ?Activity ?Rest as told by your health care provider. ?Avoid sitting for a long time without moving. Get up to take short walks every 1-2 hours. This is important to improve blood flow and breathing. Ask for help if you feel weak or unsteady. ?Return to your normal activities as told by your health care provider. Ask your health care provider what activities are safe for you. ?General instructions ? ?Watch for any blood in your urine. Call your health care provider if the amount of blood in your urine increases. ?If you have a catheter: ?Follow instructions from your health care provider about taking care of your catheter and collection bag. ?Do not take baths, swim,  or use a hot tub until your health care provider approves. Ask your health care provider if you may take showers. You may only be allowed to take sponge baths. ?Drink enough fluid to keep your urine pale yellow. ?Do not use any products that contain nicotine or tobacco, such as cigarettes, e-cigarettes, and chewing tobacco. These can delay healing after surgery. If you need help quitting, ask your health care provider. ?Keep all follow-up visits as told by your health care provider. This is important. ?Contact a health care provider if: ?You have pain that gets worse or does not get better with medicine, especially pain when you  urinate. ?You have difficulty urinating. ?You feel nauseous or you vomit repeatedly during a period of more than 2 days after the procedure. ?Get help right away if: ?Your urine is dark red or has blood clots in it. ?You are leaking urine (have incontinence). ?The end of the stent comes out of your urethra. ?You cannot urinate. ?You have sudden, sharp, or severe pain in your abdomen or lower back. ?You have a fever. ?You have swelling or pain in your legs. ?You have difficulty breathing. ?Summary ?After the procedure, it is common to have mild pain when you urinate that goes away within a few minutes after you urinate. This may last for up to 1 week. ?Watch for any blood in your urine. Call your health care provider if the amount of blood in your urine increases. ?Take over-the-counter and prescription medicines only as told by your health care provider. ?Drink enough fluid to keep your urine pale yellow. ?This information is not intended to replace advice given to you by your health care provider. Make sure you discuss any questions you have with your health care provider. ?Document Revised: 08/08/2021 Document Reviewed: 07/18/2021 ?Elsevier Patient Education ? 2022 [employer] ?General Anesthesia, Adult, Care After ?This sheet gives you information about how to care for yourself after your procedure. Your health care provider may also give you more specific instructions. If you have problems or questions, contact your health care provider. ?What can I expect after the procedure? ?After the procedure, the following side effects are common: ?Pain or discomfort at the IV site. ?Nausea. ?Vomiting. ?Sore throat. ?Trouble concentrating. ?Feeling cold or chills. ?Feeling weak or tired. ?Sleepiness and fatigue. ?Soreness and body aches. These side effects can affect parts of the body that were not involved in surgery. ?Follow these instructions at home: ?For the time period you were told by your health care  provider: ? ?Rest. ?Do not participate in activities where you could fall or become injured. ?Do not drive or use machinery. ?Do not drink alcohol. ?Do not take sleeping pills or medicines that cause drowsiness. ?Do not make important decisions or sign legal documents. ?Do not take care of children on your own. ?Eating and drinking ?Follow any instructions from your health care provider about eating or drinking restrictions. ?When you feel hungry, start by eating small amounts of foods that are soft and easy to digest (bland), such as toast. Gradually return to your regular diet. ?Drink enough fluid to keep your urine pale yellow. ?If you vomit, rehydrate by drinking water, juice, or clear broth. ?General instructions ?If you have sleep apnea, surgery and certain medicines can increase your risk for breathing problems. Follow instructions from your health care provider about wearing your sleep device: ?Anytime you are sleeping, including during daytime naps. ?While taking prescription pain medicines, sleeping medicines, or medicines that make you drowsy. ?Have  a responsible adult stay with you for the time you are told. It is important to have someone help care for you until you are awake and alert. ?Return to your normal activities as told by your health care provider. Ask your health care provider what activities are safe for you. ?Take over-the-counter and prescription medicines only as told by your health care provider. ?If you smoke, do not smoke without supervision. ?Keep all follow-up visits as told by your health care provider. This is important. ?Contact a health care provider if: ?You have nausea or vomiting that does not get better with medicine. ?You cannot eat or drink without vomiting. ?You have pain that does not get better with medicine. ?You are unable to pass urine. ?You develop a skin rash. ?You have a fever. ?You have redness around your IV site that gets worse. ?Get help right away if: ?You have  difficulty breathing. ?You have chest pain. ?You have blood in your urine or stool, or you vomit blood. ?Summary ?After the procedure, it is common to have a sore throat or nausea. It is also common to feel tired. ?Have a

## 2022-01-19 ENCOUNTER — Encounter (HOSPITAL_COMMUNITY): Payer: Self-pay

## 2022-01-19 ENCOUNTER — Encounter (HOSPITAL_COMMUNITY)
Admission: RE | Admit: 2022-01-19 | Discharge: 2022-01-19 | Disposition: A | Discharge status: Home / Self Care | Payer: Medicaid Other | Source: Ambulatory Visit | Attending: Urology | Admitting: Urology

## 2022-01-19 VITALS — BP 142/79 | HR 67 | Temp 97.7°F | Resp 18 | Ht 67.5 in | Wt 200.0 lb

## 2022-01-19 DIAGNOSIS — Z79899 Other long term (current) drug therapy: Secondary | ICD-10-CM | POA: Diagnosis not present

## 2022-01-19 DIAGNOSIS — Z01818 Encounter for other preprocedural examination: Secondary | ICD-10-CM | POA: Insufficient documentation

## 2022-01-19 HISTORY — DX: Unspecified asthma, uncomplicated: J45.909

## 2022-01-19 LAB — BASIC METABOLIC PANEL
Anion gap: 7 (ref 5–15)
BUN: 15 mg/dL (ref 6–20)
CO2: 23 mmol/L (ref 22–32)
Calcium: 9.2 mg/dL (ref 8.9–10.3)
Chloride: 106 mmol/L (ref 98–111)
Creatinine, Ser: 1.33 mg/dL — ABNORMAL HIGH (ref 0.61–1.24)
GFR, Estimated: >60 mL/min (ref >60)
Glucose, Bld: 85 mg/dL (ref 70–99)
Potassium: 3.7 mmol/L (ref 3.5–5.1)
Sodium: 136 mmol/L (ref 135–145)

## 2022-01-20 ENCOUNTER — Telehealth: Payer: Self-pay

## 2022-01-20 NOTE — Telephone Encounter (Signed)
Patient called advising he needed a refill on medication. ? ? ? ?Medication: ?HYDROcodone-acetaminophen (NORCO/VICODIN) 5-325 MG tablet ? ? ? ?Pharmacy: ?[facility] [address] [place] - [address] ? ? ?Thank you ?

## 2022-01-23 ENCOUNTER — Ambulatory Visit (HOSPITAL_COMMUNITY): Payer: Medicaid Other | Admitting: Anesthesiology

## 2022-01-23 ENCOUNTER — Ambulatory Visit (HOSPITAL_COMMUNITY): Payer: Medicaid Other

## 2022-01-23 ENCOUNTER — Encounter (HOSPITAL_COMMUNITY): Payer: Self-pay | Admitting: Urology

## 2022-01-23 ENCOUNTER — Encounter (HOSPITAL_COMMUNITY)
Admission: RE | Disposition: A | Discharge status: Home / Self Care | Payer: Self-pay | Source: Home / Self Care | Attending: Urology

## 2022-01-23 ENCOUNTER — Ambulatory Visit (HOSPITAL_COMMUNITY)
Admission: RE | Admit: 2022-01-23 | Discharge: 2022-01-23 | Disposition: A | Discharge status: Home / Self Care | Payer: Medicaid Other | Attending: Urology | Admitting: Urology

## 2022-01-23 ENCOUNTER — Ambulatory Visit (HOSPITAL_BASED_OUTPATIENT_CLINIC_OR_DEPARTMENT_OTHER): Payer: Medicaid Other | Admitting: Anesthesiology

## 2022-01-23 DIAGNOSIS — N200 Calculus of kidney: Secondary | ICD-10-CM | POA: Diagnosis not present

## 2022-01-23 DIAGNOSIS — Z87891 Personal history of nicotine dependence: Secondary | ICD-10-CM | POA: Diagnosis not present

## 2022-01-23 DIAGNOSIS — I10 Essential (primary) hypertension: Secondary | ICD-10-CM | POA: Insufficient documentation

## 2022-01-23 DIAGNOSIS — Z87442 Personal history of urinary calculi: Secondary | ICD-10-CM | POA: Diagnosis not present

## 2022-01-23 DIAGNOSIS — F209 Schizophrenia, unspecified: Secondary | ICD-10-CM | POA: Diagnosis not present

## 2022-01-23 HISTORY — PX: CYSTOSCOPY WITH RETROGRADE PYELOGRAM, URETEROSCOPY AND STENT PLACEMENT: SHX5789

## 2022-01-23 SURGERY — CYSTOURETEROSCOPY, WITH RETROGRADE PYELOGRAM AND STENT INSERTION
Anesthesia: General | Site: Ureter | Laterality: Right

## 2022-01-23 MED ORDER — ORAL CARE MOUTH RINSE: 15.0000 mL | Freq: Once | OROMUCOSAL | Status: AC

## 2022-01-23 MED ORDER — SODIUM CHLORIDE (PF) 0.9 % IJ SOLN
INTRAMUSCULAR | Status: AC
  Filled 2022-01-23: qty 10

## 2022-01-23 MED ORDER — FENTANYL CITRATE (PF) 100 MCG/2ML IJ SOLN
INTRAMUSCULAR | Status: AC
  Filled 2022-01-23: qty 2

## 2022-01-23 MED ORDER — PROPOFOL 10 MG/ML IV BOLUS
INTRAVENOUS | Status: AC
  Filled 2022-01-23: qty 20

## 2022-01-23 MED ORDER — FENTANYL CITRATE PF 50 MCG/ML IJ SOSY
50.0000 ug | PREFILLED_SYRINGE | INTRAMUSCULAR | Status: DC | PRN
  Administered 2022-01-23: 50 ug via INTRAVENOUS
  Filled 2022-01-23: qty 1

## 2022-01-23 MED ORDER — CEFAZOLIN SODIUM-DEXTROSE 2-4 GM/100ML-% IV SOLN
INTRAVENOUS | Status: AC
  Filled 2022-01-23: qty 100

## 2022-01-23 MED ORDER — LIDOCAINE HCL (CARDIAC) PF 50 MG/5ML IV SOSY
PREFILLED_SYRINGE | INTRAVENOUS | Status: DC | PRN
Start: 2022-01-23 — End: 2022-01-23
  Administered 2022-01-23: 100 mg via INTRAVENOUS

## 2022-01-23 MED ORDER — FENTANYL CITRATE (PF) 100 MCG/2ML IJ SOLN
INTRAMUSCULAR | Status: DC | PRN
  Administered 2022-01-23 (×2): 50 ug via INTRAVENOUS
  Administered 2022-01-23: 100 ug via INTRAVENOUS

## 2022-01-23 MED ORDER — ONDANSETRON HCL 4 MG/2ML IJ SOLN
INTRAMUSCULAR | Status: DC | PRN
  Administered 2022-01-23: 4 mg via INTRAVENOUS

## 2022-01-23 MED ORDER — KETOROLAC TROMETHAMINE 30 MG/ML IJ SOLN
INTRAMUSCULAR | Status: DC | PRN
  Administered 2022-01-23: 30 mg via INTRAVENOUS

## 2022-01-23 MED ORDER — LACTATED RINGERS IV SOLN: INTRAVENOUS | Status: DC

## 2022-01-23 MED ORDER — WATER FOR IRRIGATION, STERILE IR SOLN
Status: DC | PRN
  Administered 2022-01-23: 500 mL

## 2022-01-23 MED ORDER — CHLORHEXIDINE GLUCONATE 0.12 % MT SOLN
15.0000 mL | Freq: Once | OROMUCOSAL | Status: AC
  Administered 2022-01-23: 15 mL via OROMUCOSAL

## 2022-01-23 MED ORDER — OXYCODONE-ACETAMINOPHEN 5-325 MG PO TABS: 1.0000 | ORAL_TABLET | ORAL | 0 refills | Status: DC | PRN

## 2022-01-23 MED ORDER — ONDANSETRON HCL 4 MG/2ML IJ SOLN: 4.0000 mg | Freq: Once | INTRAMUSCULAR | Status: DC | PRN

## 2022-01-23 MED ORDER — FENTANYL CITRATE PF 50 MCG/ML IJ SOSY
25.0000 ug | PREFILLED_SYRINGE | INTRAMUSCULAR | Status: DC | PRN
  Administered 2022-01-23: 50 ug via INTRAVENOUS

## 2022-01-23 MED ORDER — FENTANYL CITRATE PF 50 MCG/ML IJ SOSY
PREFILLED_SYRINGE | INTRAMUSCULAR | Status: AC
  Filled 2022-01-23: qty 1

## 2022-01-23 MED ORDER — CEFAZOLIN SODIUM-DEXTROSE 2-4 GM/100ML-% IV SOLN
2.0000 g | INTRAVENOUS | Status: AC
  Administered 2022-01-23: 2 g via INTRAVENOUS

## 2022-01-23 MED ORDER — DIATRIZOATE MEGLUMINE 30 % UR SOLN
URETHRAL | Status: DC | PRN
  Administered 2022-01-23: 5 mL via URETHRAL

## 2022-01-23 MED ORDER — PROPOFOL 10 MG/ML IV BOLUS
INTRAVENOUS | Status: DC | PRN
  Administered 2022-01-23: 200 mg via INTRAVENOUS

## 2022-01-23 MED ORDER — SODIUM CHLORIDE 0.9 % IR SOLN
Status: DC | PRN
  Administered 2022-01-23 (×2): 3000 mL

## 2022-01-23 MED ORDER — DIATRIZOATE MEGLUMINE 30 % UR SOLN
URETHRAL | Status: AC
Start: 2022-01-23 — End: ?
  Filled 2022-01-23: qty 100

## 2022-01-23 SURGICAL SUPPLY — 26 items
BAG DRAIN URO TABLE W/ADPT NS (BAG) ×3 IMPLANT
BAG DRN 8 ADPR NS SKTRN CSTL (BAG) ×2
BAG HAMPER (MISCELLANEOUS) ×3 IMPLANT
CATH INTERMIT  6FR 70CM (CATHETERS) ×3 IMPLANT
CLOTH BEACON ORANGE TIMEOUT ST (SAFETY) ×3 IMPLANT
EXTRACTOR STONE NITINOL NGAGE (UROLOGICAL SUPPLIES) ×2 IMPLANT
GLOVE BIO SURGEON STRL SZ8 (GLOVE) ×3 IMPLANT
GLOVE BIOGEL PI IND STRL 6.5 (GLOVE) ×1 IMPLANT
GLOVE BIOGEL PI IND STRL 7.0 (GLOVE) ×3 IMPLANT
GLOVE BIOGEL PI INDICATOR 6.5 (GLOVE) ×1
GLOVE BIOGEL PI INDICATOR 7.0 (GLOVE) ×1
GLOVE SS BIOGEL STRL SZ 6.5 (GLOVE) ×1 IMPLANT
GLOVE SUPERSENSE BIOGEL SZ 6.5 (GLOVE) ×1
GOWN STRL REUS W/TWL LRG LVL3 (GOWN DISPOSABLE) ×3 IMPLANT
GOWN STRL REUS W/TWL XL LVL3 (GOWN DISPOSABLE) ×3 IMPLANT
GUIDEWIRE STR DUAL SENSOR (WIRE) ×3 IMPLANT
GUIDEWIRE STR ZIPWIRE 035X150 (MISCELLANEOUS) ×3 IMPLANT
IV NS IRRIG 3000ML ARTHROMATIC (IV SOLUTION) ×6 IMPLANT
KIT TURNOVER CYSTO (KITS) ×3 IMPLANT
MANIFOLD NEPTUNE II (INSTRUMENTS) ×3 IMPLANT
PACK CYSTO (CUSTOM PROCEDURE TRAY) ×3 IMPLANT
PAD ARMBOARD 7.5X6 YLW CONV (MISCELLANEOUS) ×3 IMPLANT
SHEATH URETERAL 12FRX35CM (MISCELLANEOUS) ×2 IMPLANT
STENT URET 6FRX26 CONTOUR (STENTS) ×2 IMPLANT
SYR CONTROL 10ML LL (SYRINGE) ×3 IMPLANT
WATER STERILE IRR 500ML POUR (IV SOLUTION) ×3 IMPLANT

## 2022-01-23 NOTE — Anesthesia Preprocedure Evaluation (Signed)
Anesthesia Evaluation  ?Patient identified by MRN, date of birth, ID band ?Patient awake ? ? ? ?Reviewed: ?Allergy & Precautions, H&P , NPO status , Patient's Chart, lab work & pertinent test results, reviewed documented beta blocker date and time  ? ?Airway ?Mallampati: II ? ?TM Distance: >3 FB ?Neck ROM: full ? ? ? Dental ?no notable dental hx. ? ?  ?Pulmonary ?neg pulmonary ROS, former smoker,  ?  ?Pulmonary exam normal ?breath sounds clear to auscultation ? ? ? ? ? ? Cardiovascular ?Exercise Tolerance: Good ?hypertension, negative cardio ROS ? ? ?Rhythm:regular Rate:Normal ? ? ?  ?Neuro/Psych ?PSYCHIATRIC DISORDERS Schizophrenia negative neurological ROS ?   ? GI/Hepatic ?negative GI ROS, Neg liver ROS,   ?Endo/Other  ?negative endocrine ROS ? Renal/GU ?negative Renal ROS  ?negative genitourinary ?  ?Musculoskeletal ? ? Abdominal ?  ?Peds ? Hematology ?negative hematology ROS ?(+)   ?Anesthesia Other Findings ? ? Reproductive/Obstetrics ?negative OB ROS ? ?  ? ? ? ? ? ? ? ? ? ? ? ? ? ?  ?  ? ? ? ? ? ? ? ? ?Anesthesia Physical ?Anesthesia Plan ? ?ASA: 2 ? ?Anesthesia Plan: General  ? ?Post-op Pain Management:   ? ?Induction:  ? ?PONV Risk Score and Plan: Ondansetron ? ?Airway Management Planned:  ? ?Additional Equipment:  ? ?Intra-op Plan:  ? ?Post-operative Plan:  ? ?Informed Consent: I have reviewed the patients History and Physical, chart, labs and discussed the procedure including the risks, benefits and alternatives for the proposed anesthesia with the patient or authorized representative who has indicated his/her understanding and acceptance.  ? ? ? ?Dental Advisory Given ? ?Plan Discussed with: CRNA ? ?Anesthesia Plan Comments:   ? ? ? ? ? ? ?Anesthesia Quick Evaluation ? ?

## 2022-01-23 NOTE — Op Note (Signed)
.  Preoperative diagnosis: Right Renal Calculi ? ?Postoperative diagnosis: Same ? ?Procedure: 1 cystoscopy ?2.  right retrograde pyelography ?3.  Intraoperative fluoroscopy, under one hour, with interpretation ?4.  Right ureteroscopic stone manipulation with basket extraction ?5.  Right 6 x 26 JJ stent placement ? ?Attending: Cleda Mccreedy ? ?Anesthesia: General ? ?Estimated blood loss: None ? ?Drains: Left 6 x 26 JJ ureteral stent with  tether ? ?Specimens: stone for analysis ? ?Antibiotics: ancef ? ?Findings: numerous upper, mid and low pole stones. No hydronephrosis. No masses/lesions in the bladder. Ureteral orifices in normal anatomic location. ? ?Indications: Patient is a 51 year old male with a history of right renal stones and who has persistent right flank pain.  After discussing treatment options, he decided proceed with right ureteroscopic stone manipulation. ? ?Procedure in detail: The patient was brought to the operating room and a brief timeout was done to ensure correct patient, correct procedure, correct site.  General anesthesia was administered patient was placed in dorsal lithotomy position.  His genitalia was then prepped and draped in usual sterile fashion.  A rigid 22 French cystoscope was passed in the urethra and the bladder.  Bladder was inspected free masses or lesions.  the ureteral orifices were in the normal orthotopic locations.  a 6 french ureteral catheter was then instilled into the right ureteral orifice.  a gentle retrograde was obtained and findings noted above.  we then placed a zip wire through the ureteral catheter and advanced up to the renal pelvis.  we then removed the cystoscope and cannulated the right ureteral orifice with a semirigid ureteroscope.  No stone was found in the ureter. Once we reached the UPJ a sensor wire was advanced in to the renal pelvis. We then removed the ureteroscope and advanced am 12/14 x 35cm access sheath up to the renal pelvis. We then used  the flexible ureteroscope to perform nephroscopy. We encountered the numerous stones in the lall calyces.    The stones were then removed with a Ngage basket.    once all stone fragments were removed we then removed the access sheath under direct vision and noted no injury to the ureter. We then placed a 6 x 26 double-j ureteral stent over the original zip wire.  We then removed the wire and good coil was noted in the the renal pelvis under fluoroscopy and the bladder under direct vision. the bladder was then drained and this concluded the procedure which was well tolerated by patient. ? ?Complications: None ? ?Condition: Stable, extubated, transferred to PACU ? ?Plan: Patient is to be discharged home as to follow-up in one week. He is to remove his stent in 3 days by pulling the tether ?

## 2022-01-23 NOTE — Anesthesia Procedure Notes (Signed)
Procedure Name: LMA Insertion ?Date/Time: 01/23/2022 1:36 PM ?Performed by: Franco Nones, CRNA ?Pre-anesthesia Checklist: Patient identified, Patient being monitored, Emergency Drugs available, Timeout performed and Suction available ?Patient Re-evaluated:Patient Re-evaluated prior to induction ?Oxygen Delivery Method: Circle System Utilized ?Preoxygenation: Pre-oxygenation with 100% oxygen ?Induction Type: IV induction ?Ventilation: Mask ventilation without difficulty ?LMA: LMA inserted ?LMA Size: 4.0 ?Number of attempts: 1 ?Placement Confirmation: positive ETCO2 and breath sounds checked- equal and bilateral ?Tube secured with: Tape ?Dental Injury: Teeth and Oropharynx as per pre-operative assessment  ? ? ? ? ?

## 2022-01-23 NOTE — Interval H&P Note (Signed)
History and Physical Interval Note: ? ?01/23/2022 ?12:53 PM ? ?Dustin Small  has presented today for surgery, with the diagnosis of right renal calculi.  The various methods of treatment have been discussed with the patient and family. After consideration of risks, benefits and other options for treatment, the patient has consented to  Procedure(s): ?CYSTOSCOPY WITH RETROGRADE PYELOGRAM, URETEROSCOPY AND STENT PLACEMENT (Right) ?HOLMIUM LASER APPLICATION (Right) as a surgical intervention.  The patient's history has been reviewed, patient examined, no change in status, stable for surgery.  I have reviewed the patient's chart and labs.  Questions were answered to the patient's satisfaction.   ? ? ?[name] ? ? ?

## 2022-01-23 NOTE — Transfer of Care (Signed)
Immediate Anesthesia Transfer of Care Note ? ?Patient: Dustin Small ? ?Procedure(s) Performed: CYSTOSCOPY WITH RETROGRADE PYELOGRAM, URETEROSCOPY AND STENT PLACEMENT (Right: Ureter) ? ?Patient Location: PACU ? ?Anesthesia Type:General ? ?Level of Consciousness: awake and patient cooperative ? ?Airway & Oxygen Therapy: Patient Spontanous Breathing ? ?Post-op Assessment: Report given to RN and Post -op Vital signs reviewed and stable ? ?Post vital signs: Reviewed and stable ? ?Last Vitals:  ?Vitals Value Taken Time  ?BP 152/97 01/23/22 1435  ?Temp 97.5 1436  ?Pulse 103 01/23/22 1436  ?Resp 13 01/23/22 1436  ?SpO2 100 % 01/23/22 1436  ?Vitals shown include unvalidated device data. ? ?Last Pain:  ?Vitals:  ? 01/23/22 1143  ?TempSrc: Oral  ?PainSc: 8   ?   ? ?Patients Stated Pain Goal: 9 (01/23/22 1143) ? ?Complications: No notable events documented. ?

## 2022-01-24 NOTE — Anesthesia Postprocedure Evaluation (Signed)
Anesthesia Post Note ? ?Patient: Dustin Small ? ?Procedure(s) Performed: CYSTOSCOPY WITH RETROGRADE PYELOGRAM, URETEROSCOPY AND STENT PLACEMENT (Right: Ureter) ? ?Patient location during evaluation: Phase II ?Anesthesia Type: General ?Level of consciousness: awake ?Pain management: pain level controlled ?Vital Signs Assessment: post-procedure vital signs reviewed and stable ?Respiratory status: spontaneous breathing and respiratory function stable ?Cardiovascular status: blood pressure returned to baseline and stable ?Postop Assessment: no headache and no apparent nausea or vomiting ?Anesthetic complications: no ?Comments: Late entry ? ? ?No notable events documented. ? ? ?Last Vitals:  ?Vitals:  ? 01/23/22 1515 01/23/22 1517  ?BP: (!) 150/92 (!) 157/92  ?Pulse: 90 90  ?Resp: 12 18  ?Temp:  36.9 ?C  ?SpO2: 99% 99%  ?  ?Last Pain:  ?Vitals:  ? 01/23/22 1517  ?TempSrc: Oral  ?PainSc: 0-No pain  ? ? ?  ?  ?  ?  ?  ?  ? ?[name] ? ? ? ? ?

## 2022-01-25 ENCOUNTER — Encounter (HOSPITAL_COMMUNITY): Payer: Self-pay | Admitting: Urology

## 2022-01-28 LAB — CALCULI, WITH PHOTOGRAPH (CLINICAL LAB)
Calcium Oxalate Dihydrate: 10 %
Calcium Oxalate Monohydrate: 85 %
Hydroxyapatite: 5 %
Weight Calculi: 76 mg

## 2022-01-31 ENCOUNTER — Ambulatory Visit (INDEPENDENT_AMBULATORY_CARE_PROVIDER_SITE_OTHER): Payer: Medicaid Other | Admitting: Urology

## 2022-01-31 ENCOUNTER — Encounter: Payer: Self-pay | Admitting: Urology

## 2022-01-31 VITALS — BP 137/85 | HR 71

## 2022-01-31 DIAGNOSIS — N200 Calculus of kidney: Secondary | ICD-10-CM | POA: Diagnosis not present

## 2022-01-31 LAB — URINALYSIS, ROUTINE W REFLEX MICROSCOPIC
Bilirubin, UA: NEGATIVE
Glucose, UA: NEGATIVE
Ketones, UA: NEGATIVE
Leukocytes,UA: NEGATIVE
Nitrite, UA: NEGATIVE
Protein,UA: NEGATIVE
RBC, UA: NEGATIVE
Specific Gravity, UA: 1.02 (ref 1.005–1.030)
Urobilinogen, Ur: 0.2 mg/dL (ref 0.2–1.0)
pH, UA: 7 (ref 5.0–7.5)

## 2022-01-31 MED ORDER — TAMSULOSIN HCL 0.4 MG PO CAPS: 0.4000 mg | ORAL_CAPSULE | Freq: Every day | ORAL | 1 refills | Status: AC

## 2022-01-31 MED ORDER — OXYCODONE-ACETAMINOPHEN 5-325 MG PO TABS: 1.0000 | ORAL_TABLET | ORAL | 0 refills | Status: AC | PRN

## 2022-01-31 NOTE — Patient Instructions (Signed)
Dietary Guidelines to Help Prevent Kidney Stones Kidney stones are deposits of minerals and salts that form inside your kidneys. Your risk of developing kidney stones may be greater depending on your diet, your lifestyle, the medicines you take, and whether you have certain medical conditions. Most people can lower their chances of developing kidney stones by following the instructions below. Your dietitian may give you more specific instructions depending on your overall health and the type of kidney stones you tend to develop. What are tips for following this plan? Reading food labels  Choose foods with "no salt added" or "low-salt" labels. Limit your salt (sodium) intake to less than 1,500 mg a day. Choose foods with calcium for each meal and snack. Try to eat about 300 mg of calcium at each meal. Foods that contain 200-500 mg of calcium a serving include: 8 oz (237 mL) of milk, calcium-fortifiednon-dairy milk, and calcium-fortifiedfruit juice. Calcium-fortified means that calcium has been added to these drinks. 8 oz (237 mL) of kefir, yogurt, and soy yogurt. 4 oz (114 g) of tofu. 1 oz (28 g) of cheese. 1 cup (150 g) of dried figs. 1 cup (91 g) of cooked broccoli. One 3 oz (85 g) can of sardines or mackerel. Most people need 1,000-1,500 mg of calcium a day. Talk to your dietitian about how much calcium is recommended for you. Shopping Buy plenty of fresh fruits and vegetables. Most people do not need to avoid fruits and vegetables, even if these foods contain nutrients that may contribute to kidney stones. When shopping for convenience foods, choose: Whole pieces of fruit. Pre-made salads with dressing on the side. Low-fat fruit and yogurt smoothies. Avoid buying frozen meals or prepared deli foods. These can be high in sodium. Look for foods with live cultures, such as yogurt and kefir. Choose high-fiber grains, such as whole-wheat breads, oat bran, and wheat cereals. Cooking Do not add  salt to food when cooking. Place a salt shaker on the table and allow each person to add his or her own salt to taste. Use vegetable protein, such as beans, textured vegetable protein (TVP), or tofu, instead of meat in pasta, casseroles, and soups. Meal planning Eat less salt, if told by your dietitian. To do this: Avoid eating processed or pre-made food. Avoid eating fast food. Eat less animal protein, including cheese, meat, poultry, or fish, if told by your dietitian. To do this: Limit the number of times you have meat, poultry, fish, or cheese each week. Eat a diet free of meat at least 2 days a week. Eat only one serving each day of meat, poultry, fish, or seafood. When you prepare animal protein, cut pieces into small portion sizes. For most meat and fish, one serving is about the size of the palm of your hand. Eat at least five servings of fresh fruits and vegetables each day. To do this: Keep fruits and vegetables on hand for snacks. Eat one piece of fruit or a handful of berries with breakfast. Have a salad and fruit at lunch. Have two kinds of vegetables at dinner. Limit foods that are high in a substance called oxalate. These include: Spinach (cooked), rhubarb, beets, sweet potatoes, and Swiss chard. Peanuts. Potato chips, french fries, and baked potatoes with skin on. Nuts and nut products. Chocolate. If you regularly take a diuretic medicine, make sure to eat at least 1 or 2 servings of fruits or vegetables that are high in potassium each day. These include: Avocado. Banana. Orange, prune,   carrot, or tomato juice. Baked potato. Cabbage. Beans and split peas. Lifestyle  Drink enough fluid to keep your urine pale yellow. This is the most important thing you can do. Spread your fluid intake throughout the day. If you drink alcohol: Limit how much you use to: 0-1 drink a day for women who are not pregnant. 0-2 drinks a day for men. Be aware of how much alcohol is in your  drink. In the [place] one drink equals one 12 oz bottle of beer (355 mL), one 5 oz glass of wine (148 mL), or one 1 oz glass of hard liquor (44 mL). Lose weight if told by your health care provider. Work with your dietitian to find an eating plan and weight loss strategies that work best for you. General information Talk to your health care provider and dietitian about taking daily supplements. You may be told the following depending on your health and the cause of your kidney stones: Not to take supplements with vitamin C. To take a calcium supplement. To take a daily probiotic supplement. To take other supplements such as magnesium, fish oil, or vitamin B6. Take over-the-counter and prescription medicines only as told by your health care provider. These include supplements. What foods should I limit? Limit your intake of the following foods, or eat them as told by your dietitian. Vegetables Spinach. Rhubarb. Beets. Canned vegetables. Pickles. Olives. Baked potatoes with skin. Grains Wheat bran. Baked goods. Salted crackers. Cereals high in sugar. Meats and other proteins Nuts. Nut butters. Large portions of meat, poultry, or fish. Salted, precooked, or cured meats, such as sausages, meat loaves, and hot dogs. Dairy Cheese. Beverages Regular soft drinks. Regular vegetable juice. Seasonings and condiments Seasoning blends with salt. Salad dressings. Soy sauce. Ketchup. Barbecue sauce. Other foods Canned soups. Canned pasta sauce. Casseroles. Pizza. Lasagna. Frozen meals. Potato chips. French fries. The items listed above may not be a complete list of foods and beverages you should limit. Contact a dietitian for more information. What foods should I avoid? Talk to your dietitian about specific foods you should avoid based on the type of kidney stones you have and your overall health. Fruits Grapefruit. The item listed above may not be a complete list of foods and beverages you should  avoid. Contact a dietitian for more information. Summary Kidney stones are deposits of minerals and salts that form inside your kidneys. You can lower your risk of kidney stones by making changes to your diet. The most important thing you can do is drink enough fluid. Drink enough fluid to keep your urine pale yellow. Talk to your dietitian about how much calcium you should have each day, and eat less salt and animal protein as told by your dietitian. This information is not intended to replace advice given to you by your health care provider. Make sure you discuss any questions you have with your health care provider. Document Revised: 05/23/2021 Document Reviewed: 05/23/2021 Elsevier Patient Education  2023 [employer] Inc[employer]  

## 2022-01-31 NOTE — Progress Notes (Signed)
? ?01/31/2022 ?11:00 AM  ? ?Dustin Small ?1971-04-11 ?[id number] ? ?Referring provider: Practice, [facility] ?[address] ?[place],  [place] [address] ? ?nephrolithiasis ? ? ?HPI: ?Dustin Small is a 51yo here for followup for nephrolithiasis. He has intermittent left flank pain. CT from 4/17 shows multiple left renal calculi. He underwent right ureteroscopic stone extraction 5/1 and has been doing well since surgery. No significant LUTS. No other complaints today ? ? ?PMH: ?Past Medical History:  ?Diagnosis Date  ? Asthma   ? Hepatitis C   ? states this was cured x 5years ago   ? High blood pressure   ? Schizo affective schizophrenia (HCC)   ? ? ?Surgical History: ?Past Surgical History:  ?Procedure Laterality Date  ? CYSTOSCOPY WITH RETROGRADE PYELOGRAM, URETEROSCOPY AND STENT PLACEMENT Right 01/23/2022  ? Procedure: CYSTOSCOPY WITH RETROGRADE PYELOGRAM, URETEROSCOPY AND STENT PLACEMENT;  Surgeon: Malen Gauze, MD;  Location: [facility];  Service: Urology;  Laterality: Right;  ? NO PAST SURGERIES    ? ? ?Home Medications:  ?Allergies as of 01/31/2022   ?No Known Allergies ?  ? ?  ?Medication List  ?  ? ?  ? Accurate as of Jan 31, 2022 11:00 AM. If you have any questions, ask your nurse or doctor.  ?  ?  ? ?  ? ?albuterol 108 (90 Base) MCG/ACT inhaler ?Commonly known as: VENTOLIN HFA ?Inhale 1 puff into the lungs every 6 (six) hours as needed for wheezing or shortness of breath. ?  ?HYDROcodone-acetaminophen 5-325 MG tablet ?Commonly known as: NORCO/VICODIN ?Take 1 tablet by mouth every 4 (four) hours as needed for severe pain. ?  ?hyoscyamine 0.125 MG SL tablet ?Commonly known as: LEVSIN SL ?Take 0.125 mg by mouth every 6 (six) hours as needed for cramping. ?  ?lisinopril-hydrochlorothiazide 20-25 MG tablet ?Commonly known as: ZESTORETIC ?Take 1 tablet by mouth daily. ?  ?oxyCODONE-acetaminophen 5-325 MG tablet ?Commonly known as: Percocet ?Take 1 tablet by mouth every 4 (four) hours as needed. ?  ?pantoprazole 40 MG  tablet ?Commonly known as: PROTONIX ?Take 1 tablet (40 mg total) by mouth daily before breakfast. ?What changed:  ?when to take this ?reasons to take this ?  ?risperiDONE 3 MG tablet ?Commonly known as: RISPERDAL ?Take 3 mg by mouth 2 (two) times daily. ?  ?trazodone 300 MG tablet ?Commonly known as: DESYREL ?Take 150 mg by mouth at bedtime. ?  ?Vitamin D 125 MCG (5000 UT) Caps ?Take 5,000 Units by mouth daily. ?  ? ?  ? ? ?Allergies: No Known Allergies ? ?Family History: ?No family history on file. ? ?Social History:  reports that he quit smoking about 21 years ago. His smoking use included cigarettes. He has never used smokeless tobacco. He reports that he does not drink alcohol and does not use drugs. ? ?ROS: ?All other review of systems were reviewed and are negative except what is noted above in HPI ? ?Physical Exam: ?BP 137/85   Pulse 71   ?Constitutional:  Alert and oriented, No acute distress. ?HEENT: [place] AT, moist mucus membranes.  Trachea midline, no masses. ?Cardiovascular: No clubbing, cyanosis, or edema. ?Respiratory: Normal respiratory effort, no increased work of breathing. ?GI: Abdomen is soft, nontender, nondistended, no abdominal masses ?GU: No CVA tenderness.  ?Lymph: No cervical or inguinal lymphadenopathy. ?Skin: No rashes, bruises or suspicious lesions. ?Neurologic: Grossly intact, no focal deficits, moving all 4 extremities. ?Psychiatric: Normal mood and affect. ? ?Laboratory Data: ?Lab Results  ?Component Value Date  ?  WBC 7.0 02/16/2020  ? HGB 13.5 02/16/2020  ? HCT 40.6 02/16/2020  ? MCV 93.3 02/16/2020  ? PLT 231 02/16/2020  ? ? ?Lab Results  ?Component Value Date  ? CREATININE 1.33 (H) 01/19/2022  ? ? ?No results found for: PSA ? ?No results found for: TESTOSTERONE ? ?No results found for: HGBA1C ? ?Urinalysis ?   ?Component Value Date/Time  ? COLORURINE YELLOW 07/14/2019 1455  ? APPEARANCEUR Clear 01/09/2022 0916  ? LABSPEC 1.011 07/14/2019 1455  ? PHURINE 6.0 07/14/2019 1455  ? GLUCOSEU  Negative 01/09/2022 0916  ? HGBUR NEGATIVE 07/14/2019 1455  ? BILIRUBINUR Negative 01/09/2022 0916  ? KETONESUR NEGATIVE 07/14/2019 1455  ? PROTEINUR Negative 01/09/2022 0916  ? PROTEINUR NEGATIVE 07/14/2019 1455  ? NITRITE Negative 01/09/2022 0916  ? NITRITE NEGATIVE 07/14/2019 1455  ? LEUKOCYTESUR Negative 01/09/2022 0916  ? LEUKOCYTESUR NEGATIVE 07/14/2019 1455  ? ? ?Lab Results  ?Component Value Date  ? LABMICR Comment 01/09/2022  ? ? ?Pertinent Imaging: ? ?No results found for this or any previous visit. ? ?No results found for this or any previous visit. ? ?No results found for this or any previous visit. ? ?No results found for this or any previous visit. ? ?No results found for this or any previous visit. ? ?No results found for this or any previous visit. ? ?No results found for this or any previous visit. ? ?Results for orders placed in visit on 01/09/22 ? ?CT RENAL STONE STUDY ? ?Narrative ?CLINICAL DATA:  51 year old male with flank pain ? ?EXAM: ?CT ABDOMEN AND PELVIS WITHOUT CONTRAST ? ?TECHNIQUE: ?Multidetector CT imaging of the abdomen and pelvis was performed ?following the standard protocol without IV contrast. ? ?RADIATION DOSE REDUCTION: This exam was performed according to the ?departmental dose-optimization program which includes automated ?exposure control, adjustment of the mA and/or kV according to ?patient size and/or use of iterative reconstruction technique. ? ?COMPARISON:  07/14/2019, 02/18/2020 ? ?FINDINGS: ?Lower chest: No acute finding ? ?Hepatobiliary: Unremarkable liver.  Unremarkable gallbladder. ? ?Pancreas: Unremarkable ? ?Spleen: Unremarkable ? ?Adrenals/Urinary Tract: ? ?- Right adrenal gland:  Unremarkable ? ?- Left adrenal gland: Unremarkable. ? ?- Right kidney: No hydronephrosis. No perinephric stranding. ?Multiple punctate calcifications in the collecting system of the ?right kidney. None of these are within the pelvis. Largest estimated ?4 mm. Unremarkable right  ureter. ? ?- Left Kidney: Similar configuration of the extrarenal pelvis on the ?left. No inflammatory changes. No hydronephrosis. Unremarkable left ?ureter. Multiple calcifications within the collecting system of the ?left kidney. Largest at the inferior collecting system measures 5 ?mm. ? ?- Urinary Bladder: Partially distended. No stones within the urinary ?bladder. ? ?Stomach/Bowel: ? ?- Stomach: Hiatal hernia.  Otherwise unremarkable stomach. ? ?- Small bowel: Unremarkable ? ?- Appendix: Normal. ? ?- Colon: Colonic diverticula of the left colon. No inflammatory ?changes. No significant stool burden. No transition point. ? ?Vascular/Lymphatic: Minimal atherosclerosis of the abdominal aorta. ?No aneurysm. ? ?No mesenteric, retroperitoneal, pelvic or inguinal adenopathy. ? ?Reproductive: Unremarkable ? ?Other: Small fat containing umbilical hernia. ? ?Musculoskeletal: Degenerative changes of the spine. No bony canal ?narrowing. No displaced fracture. ? ?IMPRESSION: ?No acute finding. ? ?Bilateral nephrolithiasis with no obstructive ureteral stones ?identified. ? ?Additional ancillary findings as above. ? ? ?Electronically Signed ?By: Gilmer MorJaime  Wagner D.O. ?On: 01/09/2022 10:04 ? ? ?Assessment & Plan:   ? ?1. Kidney stones ?-We discussed the management of kidney stones. These options include observation, ureteroscopy, shockwave lithotripsy (ESWL) and percutaneous nephrolithotomy (PCNL). We discussed which  options are relevant to the patient's stone(s). We discussed the natural history of kidney stones as well as the complications of untreated stones and the impact on quality of life without treatment as well as with each of the above listed treatments. We also discussed the efficacy of each treatment in its ability to clear the stone burden. With any of these management options I discussed the signs and symptoms of infection and the need for emergent treatment should these be experienced. For each option we discussed  the ability of each procedure to clear the patient of their stone burden.  ? ?For observation I described the risks which include but are not limited to silent renal damage, life-threatening infection, need for eme

## 2022-02-01 ENCOUNTER — Telehealth: Payer: Self-pay

## 2022-02-01 NOTE — Telephone Encounter (Signed)
I spoke with Dustin Small. We have discussed possible surgery dates and 03/02/2022 was agreed upon by all parties. Patient given information about surgery date, what to expect pre-operatively and post operatively.  ?  ?We discussed that a pre-op nurse will be calling to set up the pre-op visit that will take place prior to surgery. Informed patient that our office will communicate any additional care to be provided after surgery.  ?  ?Patients questions or concerns were discussed during our call. Advised to call our office should there be any additional information, questions or concerns that arise. Patient verbalized understanding.   ?

## 2022-02-27 NOTE — Patient Instructions (Signed)
Dustin Small  02/27/2022     @PREFPERIOPPHARMACY @   Your procedure is scheduled on  03/02/2022.   Report to [facility] at  [phone]  A.M.   Call this number if you have problems the morning of surgery:  [phone]   Remember:  Do not eat or drink after midnight.      Take these medicines the morning of surgery with A SIP OF WATER       hydrocodone or oxycodone(if needed), protonix, risperdal.     Do not wear jewelry, make-up or nail polish.  Do not wear lotions, powders, or perfumes, or deodorant.  Do not shave 48 hours prior to surgery.  Men may shave face and neck.  Do not bring valuables to the hospital.  [facility] not [facility] any belongings or valuables.  Contacts, dentures or bridgework may not be worn into surgery.  Leave your suitcase in the car.  After surgery it may be brought to your room.  For patients admitted to the hospital, discharge time will be determined by your treatment team.  Patients discharged the day of surgery will not be allowed to drive home and must  have someone with them for 24 hours.    Special instructions:   DO NOT smoke tobacco or vape for 24 hors before your procedure.  Please read over the following fact sheets that you were given. Anesthesia Post-op Instructions and Care and Recovery After Surgery      Ureteral Stent Implantation, Care After This sheet gives you information about how to care for yourself after your procedure. Your health care provider may also give you more specific instructions. If you have problems or questions, contact your health care provider. What can I expect after the procedure? After the procedure, it is common to have: Nausea. Mild pain when you urinate. You may feel this pain in your lower back or lower abdomen. The pain should stop within a few minutes after you urinate. This may last for up to 1 week. A small amount of blood in your urine for several days. Follow these  instructions at home: Medicines Take over-the-counter and prescription medicines only as told by your health care provider. If you were prescribed an antibiotic medicine, take it as told by your health care provider. Do not stop taking the antibiotic even if you start to feel better. Do not drive for 24 hours if you were given a sedative during your procedure. Ask your health care provider if the medicine prescribed to you requires you to avoid driving or using heavy machinery. Activity Rest as told by your health care provider. Avoid sitting for a long time without moving. Get up to take short walks every 1-2 hours. This is important to improve blood flow and breathing. Ask for help if you feel weak or unsteady. Return to your normal activities as told by your health care provider. Ask your health care provider what activities are safe for you. General instructions  Watch for any blood in your urine. Call your health care provider if the amount of blood in your urine increases. If you have a catheter: Follow instructions from your health care provider about taking care of your catheter and collection bag. Do not take baths, swim, or use a hot tub until your health care provider approves. Ask your health care provider if you may take showers. You may only be allowed to take sponge baths. Drink enough fluid  to keep your urine pale yellow. Do not use any products that contain nicotine or tobacco, such as cigarettes, e-cigarettes, and chewing tobacco. These can delay healing after surgery. If you need help quitting, ask your health care provider. Keep all follow-up visits as told by your health care provider. This is important. Contact a health care provider if: You have pain that gets worse or does not get better with medicine, especially pain when you urinate. You have difficulty urinating. You feel nauseous or you vomit repeatedly during a period of more than 2 days after the procedure. Get  help right away if: Your urine is dark red or has blood clots in it. You are leaking urine (have incontinence). The end of the stent comes out of your urethra. You cannot urinate. You have sudden, sharp, or severe pain in your abdomen or lower back. You have a fever. You have swelling or pain in your legs. You have difficulty breathing. Summary After the procedure, it is common to have mild pain when you urinate that goes away within a few minutes after you urinate. This may last for up to 1 week. Watch for any blood in your urine. Call your health care provider if the amount of blood in your urine increases. Take over-the-counter and prescription medicines only as told by your health care provider. Drink enough fluid to keep your urine pale yellow. This information is not intended to replace advice given to you by your health care provider. Make sure you discuss any questions you have with your health care provider. Document Revised: 08/08/2021 Document Reviewed: 07/18/2021 Elsevier Patient Education  2022 [employer] General Anesthesia, Adult, Care After This sheet gives you information about how to care for yourself after your procedure. Your health care provider may also give you more specific instructions. If you have problems or questions, contact your health care provider. What can I expect after the procedure? After the procedure, the following side effects are common: Pain or discomfort at the IV site. Nausea. Vomiting. Sore throat. Trouble concentrating. Feeling cold or chills. Feeling weak or tired. Sleepiness and fatigue. Soreness and body aches. These side effects can affect parts of the body that were not involved in surgery. Follow these instructions at home: For the time period you were told by your health care provider:  Rest. Do not participate in activities where you could fall or become injured. Do not drive or use machinery. Do not drink alcohol. Do not  take sleeping pills or medicines that cause drowsiness. Do not make important decisions or sign legal documents. Do not take care of children on your own. Eating and drinking Follow any instructions from your health care provider about eating or drinking restrictions. When you feel hungry, start by eating small amounts of foods that are soft and easy to digest (bland), such as toast. Gradually return to your regular diet. Drink enough fluid to keep your urine pale yellow. If you vomit, rehydrate by drinking water, juice, or clear broth. General instructions If you have sleep apnea, surgery and certain medicines can increase your risk for breathing problems. Follow instructions from your health care provider about wearing your sleep device: Anytime you are sleeping, including during daytime naps. While taking prescription pain medicines, sleeping medicines, or medicines that make you drowsy. Have a responsible adult stay with you for the time you are told. It is important to have someone help care for you until you are awake and alert. Return to your normal activities  as told by your health care provider. Ask your health care provider what activities are safe for you. Take over-the-counter and prescription medicines only as told by your health care provider. If you smoke, do not smoke without supervision. Keep all follow-up visits as told by your health care provider. This is important. Contact a health care provider if: You have nausea or vomiting that does not get better with medicine. You cannot eat or drink without vomiting. You have pain that does not get better with medicine. You are unable to pass urine. You develop a skin rash. You have a fever. You have redness around your IV site that gets worse. Get help right away if: You have difficulty breathing. You have chest pain. You have blood in your urine or stool, or you vomit blood. Summary After the procedure, it is common to have  a sore throat or nausea. It is also common to feel tired. Have a responsible adult stay with you for the time you are told. It is important to have someone help care for you until you are awake and alert. When you feel hungry, start by eating small amounts of foods that are soft and easy to digest (bland), such as toast. Gradually return to your regular diet. Drink enough fluid to keep your urine pale yellow. Return to your normal activities as told by your health care provider. Ask your health care provider what activities are safe for you. This information is not intended to replace advice given to you by your health care provider. Make sure you discuss any questions you have with your health care provider. Document Revised: 05/27/2020 Document Reviewed: 12/25/2019 Elsevier Patient Education  2023 [employer] How to Use Chlorhexidine for Bathing Chlorhexidine gluconate (CHG) is a germ-killing (antiseptic) solution that is used to clean the skin. It can get rid of the bacteria that normally live on the skin and can keep them away for about 24 hours. To clean your skin with CHG, you may be given: A CHG solution to use in the shower or as part of a sponge bath. A prepackaged cloth that contains CHG. Cleaning your skin with CHG may help lower the risk for infection: While you are staying in the intensive care unit of the hospital. If you have a vascular access, such as a central line, to provide short-term or long-term access to your veins. If you have a catheter to drain urine from your bladder. If you are on a ventilator. A ventilator is a machine that helps you breathe by moving air in and out of your lungs. After surgery. What are the risks? Risks of using CHG include: A skin reaction. Hearing loss, if CHG gets in your ears and you have a perforated eardrum. Eye injury, if CHG gets in your eyes and is not rinsed out. The CHG product catching fire. Make sure that you avoid smoking and  flames after applying CHG to your skin. Do not use CHG: If you have a chlorhexidine allergy or have previously reacted to chlorhexidine. On babies younger than 51 months of age. How to use CHG solution Use CHG only as told by your health care provider, and follow the instructions on the label. Use the full amount of CHG as directed. Usually, this is one bottle. During a shower Follow these steps when using CHG solution during a shower (unless your health care provider gives you different instructions): Start the shower. Use your normal soap and shampoo to wash your face and  hair. Turn off the shower or move out of the shower stream. Pour the CHG onto a clean washcloth. Do not use any type of brush or rough-edged sponge. Starting at your neck, lather your body down to your toes. Make sure you follow these instructions: If you will be having surgery, pay special attention to the part of your body where you will be having surgery. Scrub this area for at least 1 minute. Do not use CHG on your head or face. If the solution gets into your ears or eyes, rinse them well with water. Avoid your genital area. Avoid any areas of skin that have broken skin, cuts, or scrapes. Scrub your back and under your arms. Make sure to wash skin folds. Let the lather sit on your skin for 1-2 minutes or as long as told by your health care provider. Thoroughly rinse your entire body in the shower. Make sure that all body creases and crevices are rinsed well. Dry off with a clean towel. Do not put any substances on your body afterward--such as powder, lotion, or perfume--unless you are told to do so by your health care provider. Only use lotions that are recommended by the manufacturer. Put on clean clothes or pajamas. If it is the night before your surgery, sleep in clean sheets.  During a sponge bath Follow these steps when using CHG solution during a sponge bath (unless your health care provider gives you different  instructions): Use your normal soap and shampoo to wash your face and hair. Pour the CHG onto a clean washcloth. Starting at your neck, lather your body down to your toes. Make sure you follow these instructions: If you will be having surgery, pay special attention to the part of your body where you will be having surgery. Scrub this area for at least 1 minute. Do not use CHG on your head or face. If the solution gets into your ears or eyes, rinse them well with water. Avoid your genital area. Avoid any areas of skin that have broken skin, cuts, or scrapes. Scrub your back and under your arms. Make sure to wash skin folds. Let the lather sit on your skin for 1-2 minutes or as long as told by your health care provider. Using a different clean, wet washcloth, thoroughly rinse your entire body. Make sure that all body creases and crevices are rinsed well. Dry off with a clean towel. Do not put any substances on your body afterward--such as powder, lotion, or perfume--unless you are told to do so by your health care provider. Only use lotions that are recommended by the manufacturer. Put on clean clothes or pajamas. If it is the night before your surgery, sleep in clean sheets. How to use CHG prepackaged cloths Only use CHG cloths as told by your health care provider, and follow the instructions on the label. Use the CHG cloth on clean, dry skin. Do not use the CHG cloth on your head or face unless your health care provider tells you to. When washing with the CHG cloth: Avoid your genital area. Avoid any areas of skin that have broken skin, cuts, or scrapes. Before surgery Follow these steps when using a CHG cloth to clean before surgery (unless your health care provider gives you different instructions): Using the CHG cloth, vigorously scrub the part of your body where you will be having surgery. Scrub using a back-and-forth motion for 3 minutes. The area on your body should be completely wet  with CHG  when you are done scrubbing. Do not rinse. Discard the cloth and let the area air-dry. Do not put any substances on the area afterward, such as powder, lotion, or perfume. Put on clean clothes or pajamas. If it is the night before your surgery, sleep in clean sheets.  For general bathing Follow these steps when using CHG cloths for general bathing (unless your health care provider gives you different instructions). Use a separate CHG cloth for each area of your body. Make sure you wash between any folds of skin and between your fingers and toes. Wash your body in the following order, switching to a new cloth after each step: The front of your neck, shoulders, and chest. Both of your arms, under your arms, and your hands. Your stomach and groin area, avoiding the genitals. Your right leg and foot. Your left leg and foot. The back of your neck, your back, and your buttocks. Do not rinse. Discard the cloth and let the area air-dry. Do not put any substances on your body afterward--such as powder, lotion, or perfume--unless you are told to do so by your health care provider. Only use lotions that are recommended by the manufacturer. Put on clean clothes or pajamas. Contact a health care provider if: Your skin gets irritated after scrubbing. You have questions about using your solution or cloth. You swallow any chlorhexidine. Call your local poison control center ([phone] in the [place] Get help right away if: Your eyes itch badly, or they become very red or swollen. Your skin itches badly and is red or swollen. Your hearing changes. You have trouble seeing. You have swelling or tingling in your mouth or throat. You have trouble breathing. These symptoms may represent a serious problem that is an emergency. Do not wait to see if the symptoms will go away. Get medical help right away. Call your local emergency services (911 in the [place] Do not drive yourself to the  hospital. Summary Chlorhexidine gluconate (CHG) is a germ-killing (antiseptic) solution that is used to clean the skin. Cleaning your skin with CHG may help to lower your risk for infection. You may be given CHG to use for bathing. It may be in a bottle or in a prepackaged cloth to use on your skin. Carefully follow your health care provider's instructions and the instructions on the product label. Do not use CHG if you have a chlorhexidine allergy. Contact your health care provider if your skin gets irritated after scrubbing. This information is not intended to replace advice given to you by your health care provider. Make sure you discuss any questions you have with your health care provider. Document Revised: 11/22/2020 Document Reviewed: 11/22/2020 Elsevier Patient Education  2023 [employer].

## 2022-02-28 ENCOUNTER — Encounter (HOSPITAL_COMMUNITY)
Admission: RE | Admit: 2022-02-28 | Discharge: 2022-02-28 | Disposition: A | Discharge status: Home / Self Care | Payer: Medicaid Other | Source: Ambulatory Visit | Attending: Urology | Admitting: Urology

## 2022-02-28 ENCOUNTER — Encounter (HOSPITAL_COMMUNITY): Payer: Self-pay

## 2022-02-28 DIAGNOSIS — Z79899 Other long term (current) drug therapy: Secondary | ICD-10-CM

## 2022-03-02 ENCOUNTER — Encounter (HOSPITAL_COMMUNITY): Admission: RE | Payer: Self-pay | Source: Home / Self Care

## 2022-03-02 ENCOUNTER — Ambulatory Visit (HOSPITAL_COMMUNITY): Admission: RE | Admit: 2022-03-02 | Payer: Medicaid Other | Source: Home / Self Care | Admitting: Urology

## 2022-03-02 SURGERY — CYSTOURETEROSCOPY, WITH RETROGRADE PYELOGRAM AND STENT INSERTION
Anesthesia: General | Laterality: Left

## 2022-03-13 ENCOUNTER — Ambulatory Visit: Payer: Medicaid Other | Admitting: Urology

## 2022-03-13 DIAGNOSIS — N200 Calculus of kidney: Secondary | ICD-10-CM

## 2022-03-20 ENCOUNTER — Ambulatory Visit: Payer: Medicaid Other | Admitting: Urology

## 2022-09-25 IMAGING — CT CT RENAL STONE PROTOCOL
4 series · 13 of 46 positions shown, 18 images · non-contrast
Comparison: 07/14/2019, 02/18/2020

CLINICAL DATA: 51-year-old male with flank pain



[Series 2: axial st · axial · 0.89mm/px · z∈[+1587,+1947]mm · 7 of 98 slices shown, 12 images]
[im 13/98  soft-tissue]
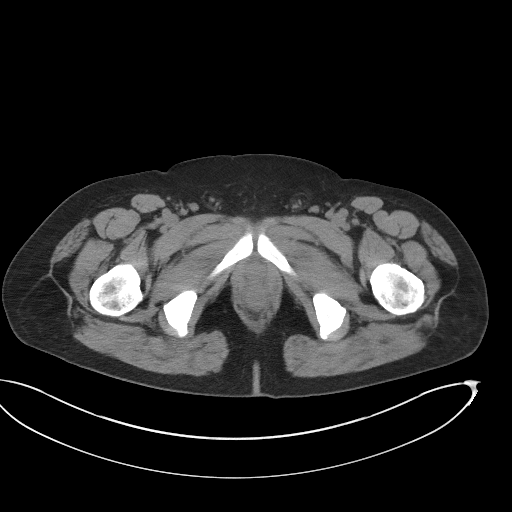
[im 13/98  bone]
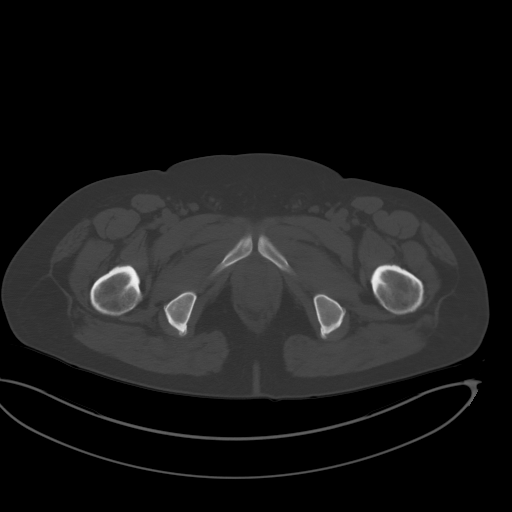
[im 25/98  soft-tissue]
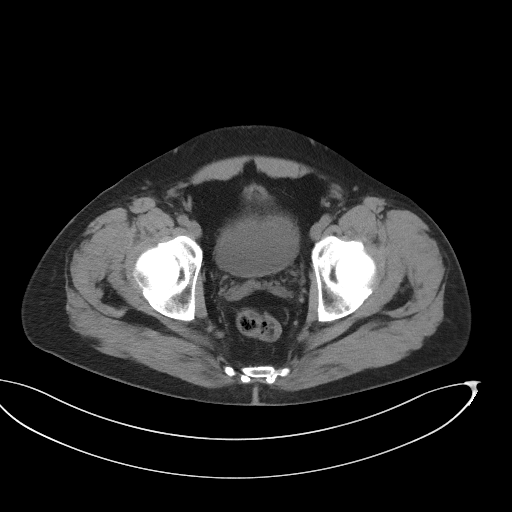
[im 37/98  soft-tissue]
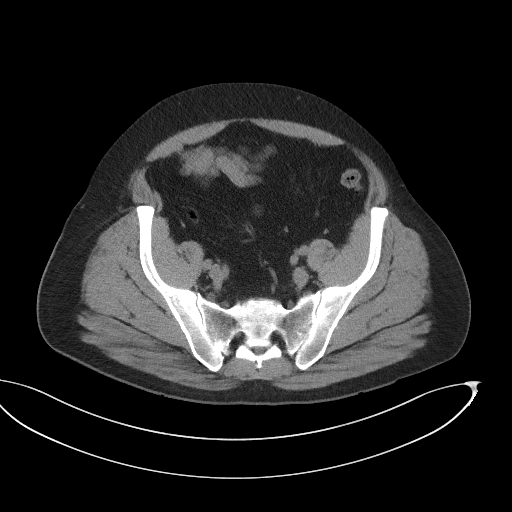
[im 49/98  soft-tissue]
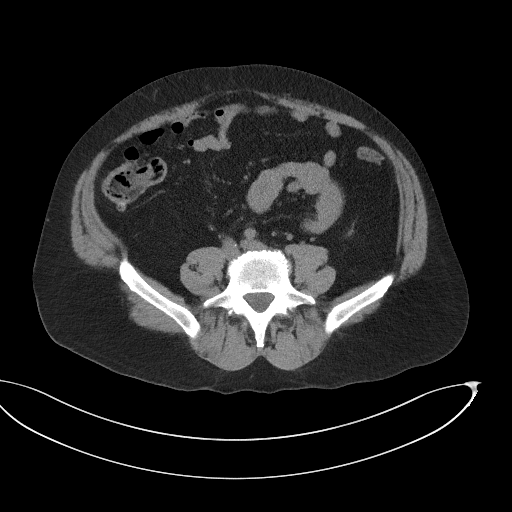
[im 49/98  lung]
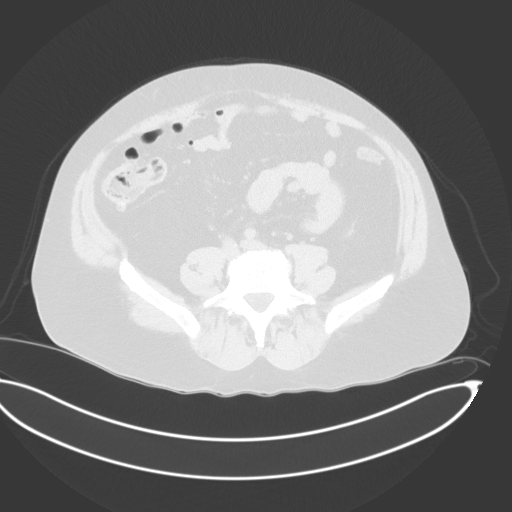
[im 61/98  soft-tissue]
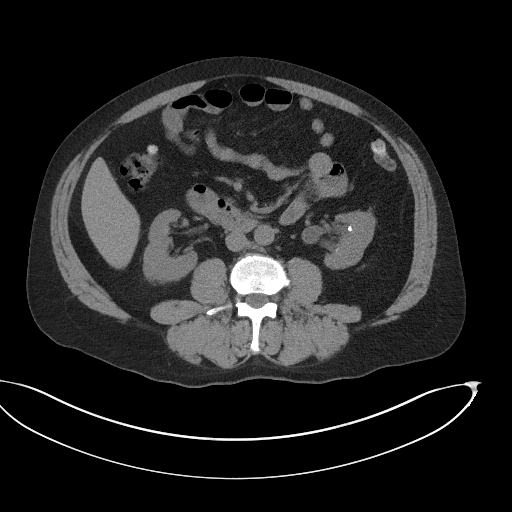
[im 61/98  lung]
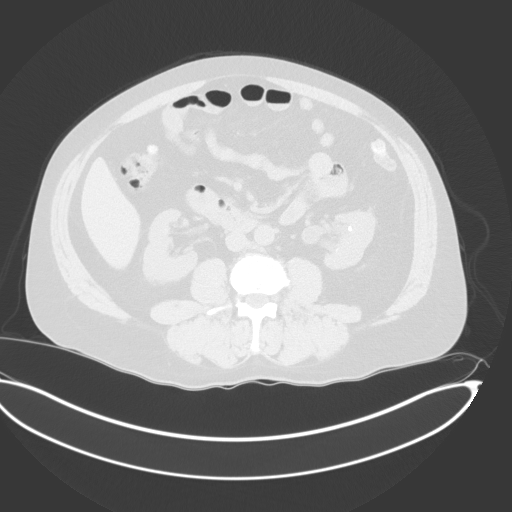
[im 73/98  soft-tissue]
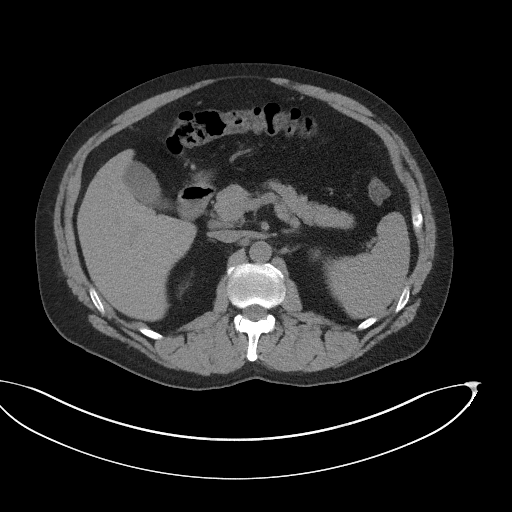
[im 73/98  lung]
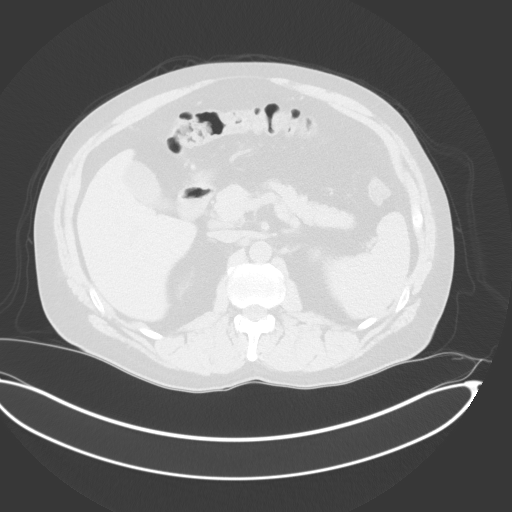
[im 85/98  soft-tissue]
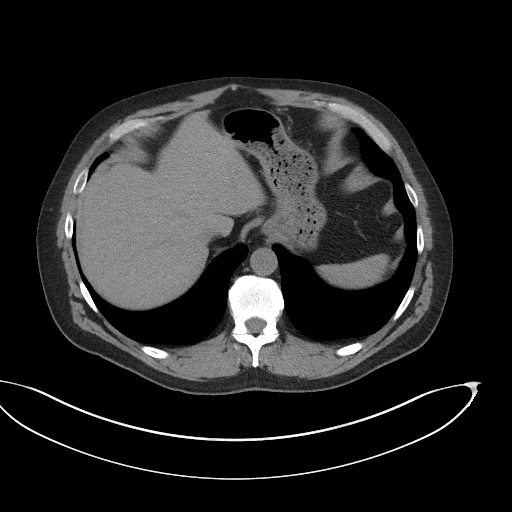
[im 85/98  lung]
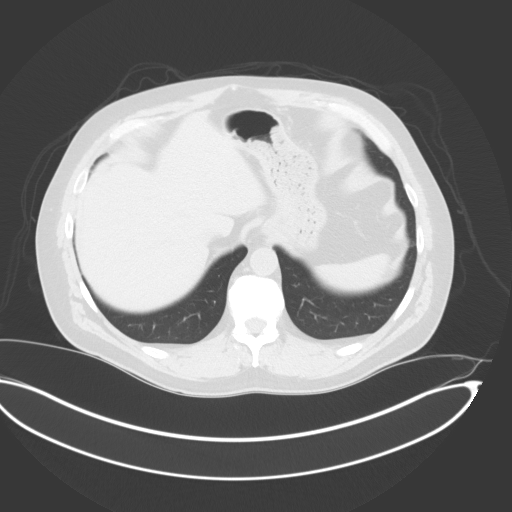

[Series 4: lung bases · axial · 0.89mm/px · z∈[+1570,+1614]mm · 2 of 244 slices shown]
[im 23/244  bone]
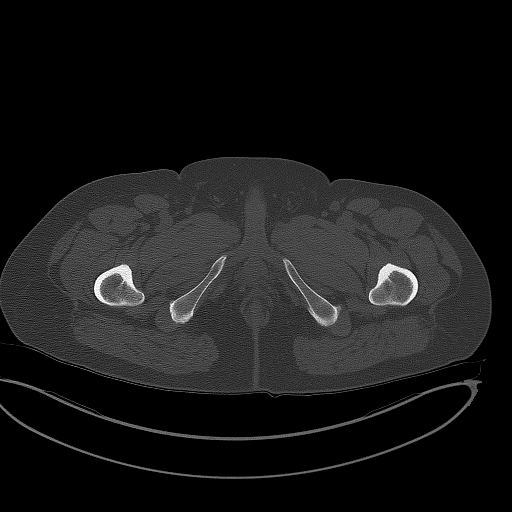
[im 45/244  bone]
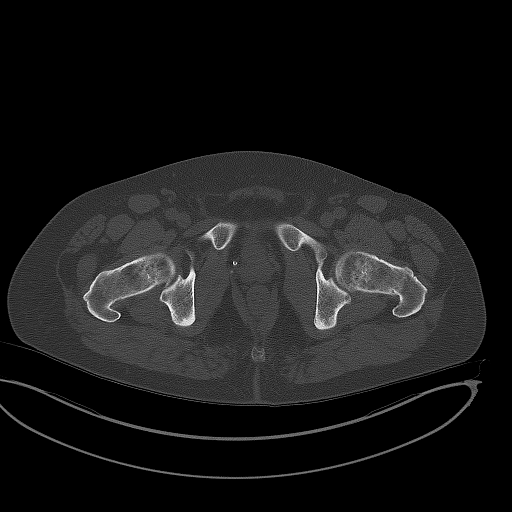

[Series 5: coronal st · coronal · 0.82mm/px · 3 of 115 slices shown]
[im 39/115  soft-tissue]
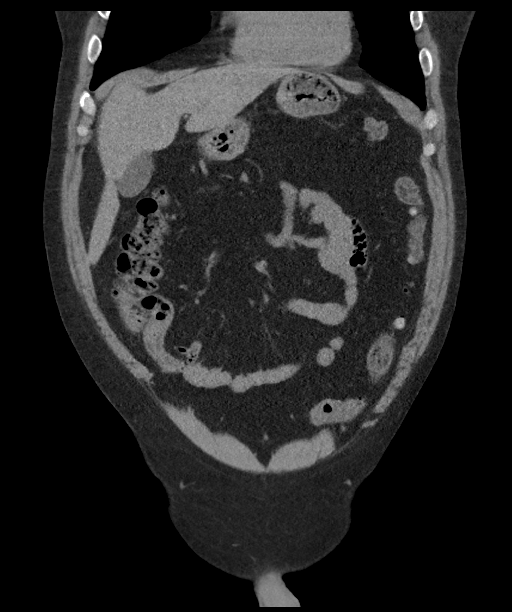
[im 51/115  soft-tissue]
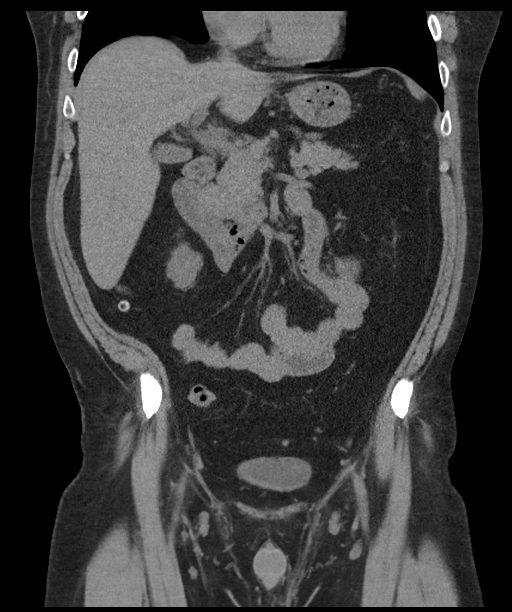
[im 64/115  soft-tissue]
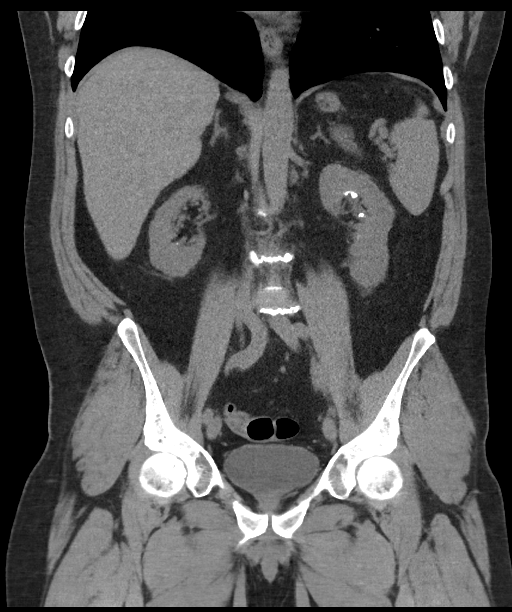

[Series 6: sagittal st · sagittal · 0.69mm/px · 1 of 132 slices shown]
[im 44/132  soft-tissue]
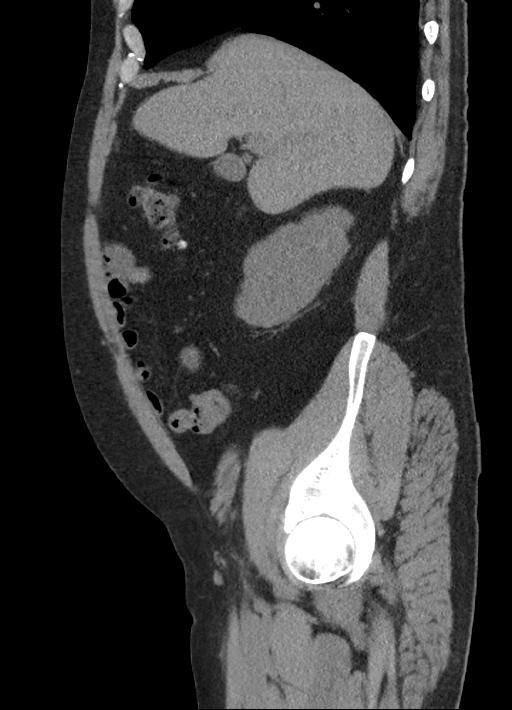

[13 of 46 positions shown; findings below may reference images not displayed]

FINDINGS: Lower chest: No acute finding

Hepatobiliary: Unremarkable liver.  Unremarkable gallbladder.

Pancreas: Unremarkable

Spleen: Unremarkable

Adrenals/Urinary Tract:

- Right adrenal gland:  Unremarkable

- Left adrenal gland: Unremarkable.

- Right kidney: No hydronephrosis. No perinephric stranding.
Multiple punctate calcifications in the collecting system of the
right kidney. None of these are within the pelvis. Largest estimated
4 mm. Unremarkable right ureter.

- Left Kidney: Similar configuration of the extrarenal pelvis on the
left. No inflammatory changes. No hydronephrosis. Unremarkable left
ureter. Multiple calcifications within the collecting system of the
left kidney. Largest at the inferior collecting system measures 5
mm.

- Urinary Bladder: Partially distended. No stones within the urinary
bladder.

Stomach/Bowel:

- Stomach: Hiatal hernia.  Otherwise unremarkable stomach.

- Small bowel: Unremarkable

- Appendix: Normal.

- Colon: Colonic diverticula of the left colon. No inflammatory
changes. No significant stool burden. No transition point.

Vascular/Lymphatic: Minimal atherosclerosis of the abdominal aorta.
No aneurysm.

No mesenteric, retroperitoneal, pelvic or inguinal adenopathy.

Reproductive: Unremarkable

Other: Small fat containing umbilical hernia.

Musculoskeletal: Degenerative changes of the spine. No bony canal
narrowing. No displaced fracture.
IMPRESSION: No acute finding.

Bilateral nephrolithiasis with no obstructive ureteral stones
identified.

Additional ancillary findings as above.

## 2023-06-20 ENCOUNTER — Encounter (INDEPENDENT_AMBULATORY_CARE_PROVIDER_SITE_OTHER): Payer: Self-pay | Admitting: *Deleted

## 2023-12-19 ENCOUNTER — Encounter (INDEPENDENT_AMBULATORY_CARE_PROVIDER_SITE_OTHER): Payer: Self-pay | Admitting: *Deleted
